# Patient Record
Sex: Female | Born: 1960 | Race: Black or African American | Hispanic: No | Marital: Single | Smoking: Former smoker
Health system: Southern US, Community
[De-identification: ages and names within clinical notes are randomized; demographics above are authoritative.]

## PROBLEM LIST (undated history)

## (undated) DIAGNOSIS — K219 Gastro-esophageal reflux disease without esophagitis: Secondary | ICD-10-CM

## (undated) DIAGNOSIS — M1990 Unspecified osteoarthritis, unspecified site: Secondary | ICD-10-CM

## (undated) DIAGNOSIS — J449 Chronic obstructive pulmonary disease, unspecified: Secondary | ICD-10-CM

## (undated) DIAGNOSIS — E119 Type 2 diabetes mellitus without complications: Secondary | ICD-10-CM

## (undated) DIAGNOSIS — M25471 Effusion, right ankle: Secondary | ICD-10-CM

## (undated) DIAGNOSIS — D649 Anemia, unspecified: Secondary | ICD-10-CM

## (undated) DIAGNOSIS — M25472 Effusion, left ankle: Secondary | ICD-10-CM

## (undated) DIAGNOSIS — IMO0001 Reserved for inherently not codable concepts without codable children: Secondary | ICD-10-CM

## (undated) DIAGNOSIS — K5900 Constipation, unspecified: Secondary | ICD-10-CM

## (undated) HISTORY — PX: HERNIA REPAIR: SHX51

## (undated) HISTORY — PX: KNEE ARTHROSCOPY: SHX127

## (undated) HISTORY — PX: FINGER SURGERY: SHX640

## (undated) HISTORY — PX: CHOLECYSTECTOMY: SHX55

## (undated) HISTORY — PX: TUBAL LIGATION: SHX77

## (undated) HISTORY — PX: COLONOSCOPY: SHX174

## (undated) HISTORY — PX: CARPAL TUNNEL RELEASE: SHX101

---

## 2015-07-28 ENCOUNTER — Other Ambulatory Visit: Payer: Self-pay | Admitting: Neurological Surgery

## 2015-07-28 DIAGNOSIS — M4316 Spondylolisthesis, lumbar region: Secondary | ICD-10-CM

## 2015-07-29 ENCOUNTER — Other Ambulatory Visit: Payer: Self-pay | Admitting: Orthopaedic Surgery

## 2015-08-07 HISTORY — PX: PARATHYROIDECTOMY: SHX19

## 2015-08-12 ENCOUNTER — Ambulatory Visit
Admission: RE | Admit: 2015-08-12 | Discharge: 2015-08-12 | Disposition: A | Discharge status: Home / Self Care | Payer: BLUE CROSS/BLUE SHIELD | Source: Ambulatory Visit | Attending: Neurological Surgery | Admitting: Neurological Surgery

## 2015-08-12 DIAGNOSIS — M4316 Spondylolisthesis, lumbar region: Secondary | ICD-10-CM

## 2015-08-27 ENCOUNTER — Inpatient Hospital Stay (HOSPITAL_COMMUNITY)
Admission: RE | Admit: 2015-08-27 | Discharge: 2015-08-27 | Disposition: A | Discharge status: Home / Self Care | Payer: Self-pay | Source: Ambulatory Visit

## 2015-08-27 NOTE — Pre-Procedure Instructions (Signed)
Murriel HopperSharon Esh  08/27/2015      [id number] - [place], [place] - [address] [place] [id number] Phone: [phone] Fax: [phone]    Your procedure is scheduled on 09/07/15  [facility] at 815 A.M.  Call this number if you have problems the morning of surgery:  [phone]   Remember:  Do not eat food or drink liquids after midnight.  Take these medicines the morning of surgery with A SIP OF WATER prilosec, pain med and advair if needed   STOP all herbel meds, nsaids (aleve,naproxen,advil,ibuprofen) 5 days prior to surgery starting 09/01/15 including aspirin, vit D    How to Manage Your Diabetes Before Surgery   Why is it important to control my blood sugar before and after surgery?   Improving blood sugar levels before and after surgery helps healing and can limit problems.  A way of improving blood sugar control is eating a healthy diet by:  - Eating less sugar and carbohydrates  - Increasing activity/exercise  - Talk with your doctor about reaching your blood sugar goals  High blood sugars (greater than 180 mg/dL) can raise your risk of infections and slow down your recovery so you will need to focus on controlling your diabetes during the weeks before surgery.  Make sure that the doctor who takes care of your diabetes knows about your planned surgery including the date and location.  How do I manage my blood sugars before surgery?   Check your blood sugar at least 4 times a day, 2 days before surgery to make sure that they are not too high or low.   Check your blood sugar the morning of your surgery when you wake up and every 2               hours until you get to the Short-Stay unit.  If your blood sugar is less than 70 mg/dL, you will need to treat for low blood sugar by:  Treat a low blood sugar (less than 70 mg/dL) with 1/2 cup of clear juice (cranberry or apple), 4  glucose tablets, OR glucose gel.  Recheck blood sugar in 15 minutes after treatment (to make sure it is greater than 70 mg/dL).  If blood sugar is not greater than 70 mg/dL on re-check, call [phone] for further instructions.   Report your blood sugar to the Short-Stay nurse when you get to Short-Stay.  References:  [facility], 2007 "How to Manage your Diabetes Before and After Surgery".  What do I do about my diabetes medications?   Do not take oral diabetes medicines (pills) the morning of surgery.(metformin)   Do not wear jewelry, make-up or nail polish.  Do not wear lotions, powders, or perfumes.  You may wear deodorant.  Do not shave 48 hours prior to surgery.  Men may shave face and neck.  Do not bring valuables to the hospital.  [facility] is not responsible for any belongings or valuables.  Contacts, dentures or bridgework may not be worn into surgery.  Leave your suitcase in the car.  After surgery it may be brought to your room.  For patients admitted to the hospital, discharge time will be determined by your treatment team.  Patients discharged the day of surgery will not be allowed to drive home.   Name and phone number of your driver:    Special instructions:  Special Instructions:  - Preparing for Surgery  Before surgery, you can play an important role.  Because skin is not sterile, your skin needs to be as free of germs as possible.  You can reduce the number of germs on you skin by washing with CHG (chlorahexidine gluconate) soap before surgery.  CHG is an antiseptic cleaner which kills germs and bonds with the skin to continue killing germs even after washing.  Please DO NOT use if you have an allergy to CHG or antibacterial soaps.  If your skin becomes reddened/irritated stop using the CHG and inform your nurse when you arrive at Short Stay.  Do not shave (including legs and underarms) for at least 48 hours prior to the  first CHG shower.  You may shave your face.  Please follow these instructions carefully:   1.  Shower with CHG Soap the night before surgery and the morning of Surgery.  2.  If you choose to wash your hair, wash your hair first as usual with your normal shampoo.  3.  After you shampoo, rinse your hair and body thoroughly to remove the Shampoo.  4.  Use CHG as you would any other liquid soap.  You can apply chg directly  to the skin and wash gently with scrungie or a clean washcloth.  5.  Apply the CHG Soap to your body ONLY FROM THE NECK DOWN.  Do not use on open wounds or open sores.  Avoid contact with your eyes ears, mouth and genitals (private parts).  Wash genitals (private parts)       with your normal soap.  6.  Wash thoroughly, paying special attention to the area where your surgery will be performed.  7.  Thoroughly rinse your body with warm water from the neck down.  8.  DO NOT shower/wash with your normal soap after using and rinsing off the CHG Soap.  9.  Pat yourself dry with a clean towel.            10.  Wear clean pajamas.            11.  Place clean sheets on your bed the night of your first shower and do not sleep with pets.  Day of Surgery  Do not apply any lotions/deodorants the morning of surgery.  Please wear clean clothes to the hospital/surgery center.  Please read over the following fact sheets that you were given. Pain Booklet, Coughing and Deep Breathing, Total Joint Packet, MRSA Information and Surgical Site Infection Prevention

## 2015-09-03 ENCOUNTER — Encounter (HOSPITAL_COMMUNITY): Payer: Self-pay

## 2015-09-03 ENCOUNTER — Encounter (HOSPITAL_COMMUNITY)
Admission: RE | Admit: 2015-09-03 | Discharge: 2015-09-03 | Disposition: A | Discharge status: Home / Self Care | Payer: BLUE CROSS/BLUE SHIELD | Source: Ambulatory Visit | Attending: Orthopaedic Surgery | Admitting: Orthopaedic Surgery

## 2015-09-03 ENCOUNTER — Ambulatory Visit (HOSPITAL_COMMUNITY)
Admission: RE | Admit: 2015-09-03 | Discharge: 2015-09-03 | Disposition: A | Discharge status: Home / Self Care | Payer: BLUE CROSS/BLUE SHIELD | Source: Ambulatory Visit | Attending: Orthopaedic Surgery | Admitting: Orthopaedic Surgery

## 2015-09-03 ENCOUNTER — Encounter (INDEPENDENT_AMBULATORY_CARE_PROVIDER_SITE_OTHER): Payer: Self-pay

## 2015-09-03 DIAGNOSIS — R001 Bradycardia, unspecified: Secondary | ICD-10-CM | POA: Diagnosis not present

## 2015-09-03 DIAGNOSIS — Z0183 Encounter for blood typing: Secondary | ICD-10-CM | POA: Diagnosis not present

## 2015-09-03 DIAGNOSIS — Z87891 Personal history of nicotine dependence: Secondary | ICD-10-CM | POA: Diagnosis not present

## 2015-09-03 DIAGNOSIS — M5134 Other intervertebral disc degeneration, thoracic region: Secondary | ICD-10-CM | POA: Diagnosis not present

## 2015-09-03 DIAGNOSIS — M179 Osteoarthritis of knee, unspecified: Secondary | ICD-10-CM | POA: Insufficient documentation

## 2015-09-03 DIAGNOSIS — Z01818 Encounter for other preprocedural examination: Secondary | ICD-10-CM | POA: Diagnosis present

## 2015-09-03 DIAGNOSIS — Z01812 Encounter for preprocedural laboratory examination: Secondary | ICD-10-CM | POA: Diagnosis not present

## 2015-09-03 HISTORY — DX: Gastro-esophageal reflux disease without esophagitis: K21.9

## 2015-09-03 HISTORY — DX: Type 2 diabetes mellitus without complications: E11.9

## 2015-09-03 HISTORY — DX: Effusion, right ankle: M25.471

## 2015-09-03 HISTORY — DX: Effusion, left ankle: M25.472

## 2015-09-03 HISTORY — DX: Chronic obstructive pulmonary disease, unspecified: J44.9

## 2015-09-03 HISTORY — DX: Constipation, unspecified: K59.00

## 2015-09-03 HISTORY — DX: Unspecified osteoarthritis, unspecified site: M19.90

## 2015-09-03 LAB — CBC WITH DIFFERENTIAL/PLATELET
Basophils Absolute: 0 10*3/uL (ref 0.0–0.1)
Basophils Relative: 0 %
EOS ABS: 0.2 10*3/uL (ref 0.0–0.7)
Eosinophils Relative: 2 %
HEMATOCRIT: 36.5 % (ref 36.0–46.0)
HEMOGLOBIN: 11.6 g/dL — AB (ref 12.0–15.0)
LYMPHS ABS: 3.2 10*3/uL (ref 0.7–4.0)
Lymphocytes Relative: 41 %
MCH: 25.7 pg — AB (ref 26.0–34.0)
MCHC: 31.8 g/dL (ref 30.0–36.0)
MCV: 80.8 fL (ref 78.0–100.0)
MONOS PCT: 5 %
Monocytes Absolute: 0.4 10*3/uL (ref 0.1–1.0)
NEUTROS ABS: 4 10*3/uL (ref 1.7–7.7)
NEUTROS PCT: 52 %
Platelets: 288 10*3/uL (ref 150–400)
RBC: 4.52 MIL/uL (ref 3.87–5.11)
RDW: 14.2 % (ref 11.5–15.5)
WBC: 7.6 10*3/uL (ref 4.0–10.5)

## 2015-09-03 LAB — TYPE AND SCREEN
ABO/RH(D): B NEG
Antibody Screen: NEGATIVE

## 2015-09-03 LAB — APTT: aPTT: 32 seconds (ref 24–37)

## 2015-09-03 LAB — URINALYSIS, ROUTINE W REFLEX MICROSCOPIC
Glucose, UA: NEGATIVE mg/dL
Ketones, ur: NEGATIVE mg/dL
NITRITE: NEGATIVE
Protein, ur: 30 mg/dL — AB
Specific Gravity, Urine: 1.024 (ref 1.005–1.030)
UROBILINOGEN UA: 1 mg/dL (ref 0.0–1.0)
pH: 6 (ref 5.0–8.0)

## 2015-09-03 LAB — BASIC METABOLIC PANEL
Anion gap: 9 (ref 5–15)
BUN: 10 mg/dL (ref 6–20)
CHLORIDE: 103 mmol/L (ref 101–111)
CO2: 24 mmol/L (ref 22–32)
Calcium: 8.9 mg/dL (ref 8.9–10.3)
Creatinine, Ser: 0.86 mg/dL (ref 0.44–1.00)
GFR calc non Af Amer: >60 mL/min (ref >60)
Glucose, Bld: 91 mg/dL (ref 65–99)
Potassium: 3.7 mmol/L (ref 3.5–5.1)
Sodium: 136 mmol/L (ref 135–145)

## 2015-09-03 LAB — SURGICAL PCR SCREEN
MRSA, PCR: NEGATIVE
Staphylococcus aureus: NEGATIVE

## 2015-09-03 LAB — URINE MICROSCOPIC-ADD ON

## 2015-09-03 LAB — ABO/RH: ABO/RH(D): B NEG

## 2015-09-03 LAB — PROTIME-INR
INR: 1.07 (ref 0.00–1.49)
Prothrombin Time: 14.1 seconds (ref 11.6–15.2)

## 2015-09-03 LAB — GLUCOSE, CAPILLARY: Glucose-Capillary: 98 mg/dL (ref 65–99)

## 2015-09-03 NOTE — Progress Notes (Signed)
PCP is Warrick ParisianSonia Duggal in [place]  Patient denied having any acute cardiac issues, but did inform Nurse that she has COPD, but denied having any acute shortness of breath.  Nurse inquired about blood glucose and patient stated the highest her blood sugar has been was 149 and the lowest was 89. Patient was unaware of what her last A1C was.

## 2015-09-03 NOTE — Progress Notes (Signed)
Add on slip for urine culture released and sent to main lab.

## 2015-09-03 NOTE — Progress Notes (Signed)
Urine results called in to Agustin CreeKathy Blume at Dr. Nolon Nationsalldorf's office. Kathy instructed Nurse to order a urine culture. Will enter orders.

## 2015-09-03 NOTE — Pre-Procedure Instructions (Signed)
Rhonda HopperSharon Salada  09/03/2015     Your procedure is scheduled on : Tuesday September 14, 2015.  Report [facility] at 11:15 A.M.  Call this number if you have problems the morning of surgery: [phone]    Remember:  Do not eat food or drink liquids after midnight.  Take these medicines the morning of surgery with A SIP OF WATER : Acetaminophen (Tylenol) if needed, Advair inhaler, Omeprazole (Prilosec), Oxycodone (Percocet) if needed   Stop taking any vitamins,herbal medications, Ibuprofen, Advil, Motrin, Aleve, etc on Tuesday November 1st   Do NOT take any diabetic pills the morning of your surgery (NO Metformin/Glucophage)   How to Manage Your Diabetes Before Surgery   Why is it important to control my blood sugar before and after surgery?   Improving blood sugar levels before and after surgery helps healing and can limit problems.  A way of improving blood sugar control is eating a healthy diet by:  - Eating less sugar and carbohydrates  - Increasing activity/exercise  - Talk with your doctor about reaching your blood sugar goals  High blood sugars (greater than 180 mg/dL) can raise your risk of infections and slow down your recovery so you will need to focus on controlling your diabetes during the weeks before surgery.  Make sure that the doctor who takes care of your diabetes knows about your planned surgery including the date and location.  How do I manage my blood sugars before surgery?   Check your blood sugar at least 4 times a day, 2 days before surgery to make sure that they are not too high or low.   Check your blood sugar the morning of your surgery when you wake up and every 2 hours until you get to the Short-Stay unit.  If your blood sugar is less than 70 mg/dL, you will need to treat for low blood sugar by:  Treat a low blood sugar (less than 70 mg/dL) with 1/2 cup of clear juice (cranberry or apple), 4 glucose tablets, OR glucose  gel.  Recheck blood sugar in 15 minutes after treatment (to make sure it is greater than 70 mg/dL).  If blood sugar is not greater than 70 mg/dL on re-check, call [phone] for further instructions.   Report your blood sugar to the Short-Stay nurse when you get to Short-Stay.  References:  [facility], 2007 "How to Manage your Diabetes Before and After Surgery".  What do I do about my diabetes medications?   Do not take oral diabetes medicines (pills) the morning of surgery.   Do not wear jewelry, make-up or nail polish.  Do not wear lotions, powders, or perfumes.   Do not shave 48 hours prior to surgery.    Do not bring valuables to the hospital.  [facility] is not responsible for any belongings or valuables.  Contacts, dentures or bridgework may not be worn into surgery.  Leave your suitcase in the car.  After surgery it may be brought to your room.  For patients admitted to the hospital, discharge time will be determined by your treatment team.  Patients discharged the day of surgery will not be allowed to drive home.   Name and phone number of your driver:    Special instructions:  Shower using CHG soap the night before and the morning of your surgery  Please read over the following fact sheets that you were given. Pain Booklet, Coughing and Deep Breathing, Blood Transfusion  Information, MRSA Information and Surgical Site Infection Prevention

## 2015-09-04 LAB — URINE CULTURE

## 2015-09-04 LAB — HEMOGLOBIN A1C
HEMOGLOBIN A1C: 6.1 % — AB (ref 4.8–5.6)
Mean Plasma Glucose: 128 mg/dL

## 2015-09-13 NOTE — H&P (Signed)
TOTAL KNEE ADMISSION H&P  Patient is being admitted for right total knee arthroplasty.  Subjective:  Chief Complaint:right knee pain.  HPI: Rhonda Ward, 54 y.o. female, has a history of pain and functional disability in the right knee due to arthritis and has failed non-surgical conservative treatments for greater than 12 weeks to includeNSAID's and/or analgesics, corticosteriod injections, viscosupplementation injections, supervised PT with diminished ADL's post treatment, weight reduction as appropriate and activity modification.  Onset of symptoms was gradual, starting 5 years ago with gradually worsening course since that time. The patient noted no past surgery on the right knee(s).  Patient currently rates pain in the right knee(s) at 9 out of 10 with activity. Patient has night pain, worsening of pain with activity and weight bearing, pain that interferes with activities of daily living, pain with passive range of motion, crepitus and joint swelling.  Patient has evidence of subchondral sclerosis, periarticular osteophytes and joint space narrowing by imaging studies. This patient has had no. There is no active infection.  There are no active problems to display for this patient.  Past Medical History  Diagnosis Date  . COPD (chronic obstructive pulmonary disease) (HCC)   . Diabetes mellitus without complication (HCC)     Type 2  . Constipation   . GERD (gastroesophageal reflux disease)   . Arthritis   . Swelling of both ankles     Past Surgical History  Procedure Laterality Date  . Carpal tunnel release Bilateral   . Knee arthroscopy Right   . Cholecystectomy    . Hernia repair      X 3  . Tubal ligation    . Colonoscopy    . Finger surgery Bilateral     Thumbs  . Parathyroidectomy  October 2016    No prescriptions prior to admission   No Known Allergies  Social History  Substance Use Topics  . Smoking status: Former Games developer  . Smokeless tobacco: Not on file  .  Alcohol Use: No    No family history on file.   Review of Systems  All other systems reviewed and are negative.   Objective:  Physical Exam  Constitutional: She is oriented to person, place, and time. She appears well-nourished.  HENT:  Head: Normocephalic.  Eyes: Pupils are equal, round, and reactive to light.  Neck: Neck supple.  Cardiovascular: Normal rate.   Respiratory: Effort normal.  GI: Soft.  Musculoskeletal:  Pain and right knee severe with range of motion.  Tracy effusion crepitation 1+ pain medial joint line walks with antalgic gait  Neurological: She is oriented to person, place, and time.  Skin: Skin is dry.  Psychiatric: She has a normal mood and affect.    Vital signs in last 24 hours:    Labs:   There is no height or weight on file to calculate BMI.   Imaging Review Plain radiographs demonstrate severe degenerative joint disease of the right knee(s). The overall alignment ismild valgus. The bone quality appears to be good for age and reported activity level.  Assessment/Plan:  End stage arthritis, right kneePrimary osteoarthritis right knee   The patient history, physical examination, clinical judgment of the provider and imaging studies are consistent with end stage degenerative joint disease of the right knee(s) and total knee arthroplasty is deemed medically necessary. The treatment options including medical management, injection therapy arthroscopy and arthroplasty were discussed at length. The risks and benefits of total knee arthroplasty were presented and reviewed. The risks due to aseptic loosening,  infection, stiffness, patella tracking problems, thromboembolic complications and other imponderables were discussed. The patient acknowledged the explanation, agreed to proceed with the plan and consent was signed. Patient is being admitted for inpatient treatment for surgery, pain control, PT, OT, prophylactic antibiotics, VTE prophylaxis, progressive  ambulation and ADL's and discharge planning. The patient is planning to be discharged home with home health services

## 2015-09-14 ENCOUNTER — Inpatient Hospital Stay (HOSPITAL_COMMUNITY)
Admission: RE | Admit: 2015-09-14 | Discharge: 2015-09-17 | DRG: 470 | Disposition: A | Discharge status: Home Health | Payer: BLUE CROSS/BLUE SHIELD | Source: Ambulatory Visit | Attending: Orthopaedic Surgery | Admitting: Orthopaedic Surgery

## 2015-09-14 ENCOUNTER — Encounter (HOSPITAL_COMMUNITY): Payer: Self-pay | Admitting: Anesthesiology

## 2015-09-14 ENCOUNTER — Inpatient Hospital Stay (HOSPITAL_COMMUNITY): Payer: BLUE CROSS/BLUE SHIELD | Admitting: Anesthesiology

## 2015-09-14 ENCOUNTER — Encounter (HOSPITAL_COMMUNITY)
Admission: RE | Disposition: A | Discharge status: Home Health | Payer: Self-pay | Source: Ambulatory Visit | Attending: Orthopaedic Surgery

## 2015-09-14 DIAGNOSIS — D649 Anemia, unspecified: Secondary | ICD-10-CM | POA: Diagnosis present

## 2015-09-14 DIAGNOSIS — J449 Chronic obstructive pulmonary disease, unspecified: Secondary | ICD-10-CM | POA: Diagnosis present

## 2015-09-14 DIAGNOSIS — Z87891 Personal history of nicotine dependence: Secondary | ICD-10-CM

## 2015-09-14 DIAGNOSIS — K219 Gastro-esophageal reflux disease without esophagitis: Secondary | ICD-10-CM | POA: Diagnosis present

## 2015-09-14 DIAGNOSIS — E119 Type 2 diabetes mellitus without complications: Secondary | ICD-10-CM | POA: Diagnosis present

## 2015-09-14 DIAGNOSIS — M1711 Unilateral primary osteoarthritis, right knee: Principal | ICD-10-CM | POA: Diagnosis present

## 2015-09-14 DIAGNOSIS — E118 Type 2 diabetes mellitus with unspecified complications: Secondary | ICD-10-CM | POA: Diagnosis present

## 2015-09-14 DIAGNOSIS — N390 Urinary tract infection, site not specified: Secondary | ICD-10-CM | POA: Diagnosis present

## 2015-09-14 HISTORY — DX: Anemia, unspecified: D64.9

## 2015-09-14 HISTORY — DX: Reserved for inherently not codable concepts without codable children: IMO0001

## 2015-09-14 HISTORY — PX: TOTAL KNEE ARTHROPLASTY: SHX125

## 2015-09-14 LAB — GLUCOSE, CAPILLARY
GLUCOSE-CAPILLARY: 114 mg/dL — AB (ref 65–99)
Glucose-Capillary: 118 mg/dL — ABNORMAL HIGH (ref 65–99)
Glucose-Capillary: 96 mg/dL (ref 65–99)
Glucose-Capillary: 87 mg/dL (ref 65–99)

## 2015-09-14 SURGERY — ARTHROPLASTY, KNEE, TOTAL
Anesthesia: Spinal | Site: Knee | Laterality: Right

## 2015-09-14 MED ORDER — METHOCARBAMOL 500 MG PO TABS
500.0000 mg | ORAL_TABLET | Freq: Four times a day (QID) | ORAL | Status: DC | PRN
  Administered 2015-09-14 – 2015-09-17 (×6): 500 mg via ORAL
  Filled 2015-09-14 (×5): qty 1

## 2015-09-14 MED ORDER — CEFAZOLIN SODIUM-DEXTROSE 2-3 GM-% IV SOLR
2.0000 g | INTRAVENOUS | Status: AC
  Administered 2015-09-14: 2 g via INTRAVENOUS

## 2015-09-14 MED ORDER — LACTATED RINGERS IV SOLN
INTRAVENOUS | Status: DC
  Administered 2015-09-14: 10:00:00 via INTRAVENOUS
  Administered 2015-09-14: 50 mL/h via INTRAVENOUS

## 2015-09-14 MED ORDER — CHLORHEXIDINE GLUCONATE 4 % EX LIQD: 60.0000 mL | Freq: Once | CUTANEOUS | Status: DC

## 2015-09-14 MED ORDER — PROMETHAZINE HCL 25 MG/ML IJ SOLN: 6.2500 mg | INTRAMUSCULAR | Status: DC | PRN

## 2015-09-14 MED ORDER — BUPIVACAINE LIPOSOME 1.3 % IJ SUSP
INTRAMUSCULAR | Status: DC | PRN
  Administered 2015-09-14: 20 mL

## 2015-09-14 MED ORDER — LACTATED RINGERS IV SOLN
INTRAVENOUS | Status: DC
  Administered 2015-09-14 (×2): via INTRAVENOUS

## 2015-09-14 MED ORDER — METHOCARBAMOL 500 MG PO TABS
ORAL_TABLET | ORAL | Status: AC
  Administered 2015-09-14: 500 mg via ORAL
  Filled 2015-09-14: qty 1

## 2015-09-14 MED ORDER — METOCLOPRAMIDE HCL 5 MG PO TABS: 5.0000 mg | ORAL_TABLET | Freq: Three times a day (TID) | ORAL | Status: DC | PRN

## 2015-09-14 MED ORDER — PHENOL 1.4 % MT LIQD
1.0000 | OROMUCOSAL | Status: DC | PRN
Start: 2015-09-14 — End: 2015-09-17

## 2015-09-14 MED ORDER — PANTOPRAZOLE SODIUM 40 MG PO TBEC
40.0000 mg | DELAYED_RELEASE_TABLET | Freq: Every day | ORAL | Status: DC
  Administered 2015-09-15 – 2015-09-17 (×3): 40 mg via ORAL
  Filled 2015-09-14 (×3): qty 1

## 2015-09-14 MED ORDER — BUPIVACAINE IN DEXTROSE 0.75-8.25 % IT SOLN
INTRATHECAL | Status: DC | PRN
  Administered 2015-09-14: 2 mL via INTRATHECAL

## 2015-09-14 MED ORDER — FENTANYL CITRATE (PF) 250 MCG/5ML IJ SOLN
INTRAMUSCULAR | Status: AC
  Filled 2015-09-14: qty 5

## 2015-09-14 MED ORDER — FUROSEMIDE 40 MG PO TABS
40.0000 mg | ORAL_TABLET | Freq: Every day | ORAL | Status: DC
  Administered 2015-09-15 – 2015-09-16 (×2): 40 mg via ORAL
  Filled 2015-09-14 (×2): qty 1

## 2015-09-14 MED ORDER — INFLUENZA VAC SPLIT QUAD 0.5 ML IM SUSY
0.5000 mL | PREFILLED_SYRINGE | INTRAMUSCULAR | Status: DC
Start: 2015-09-15 — End: 2015-09-17

## 2015-09-14 MED ORDER — ACETAMINOPHEN 650 MG RE SUPP: 650.0000 mg | Freq: Four times a day (QID) | RECTAL | Status: DC | PRN

## 2015-09-14 MED ORDER — ASPIRIN EC 325 MG PO TBEC
325.0000 mg | DELAYED_RELEASE_TABLET | Freq: Two times a day (BID) | ORAL | Status: DC
  Administered 2015-09-15 – 2015-09-17 (×5): 325 mg via ORAL
  Filled 2015-09-14 (×6): qty 1

## 2015-09-14 MED ORDER — METOCLOPRAMIDE HCL 5 MG/ML IJ SOLN
5.0000 mg | Freq: Three times a day (TID) | INTRAMUSCULAR | Status: DC | PRN
  Administered 2015-09-14: 10 mg via INTRAVENOUS
  Filled 2015-09-14: qty 2

## 2015-09-14 MED ORDER — MOMETASONE FURO-FORMOTEROL FUM 100-5 MCG/ACT IN AERO
2.0000 | INHALATION_SPRAY | Freq: Two times a day (BID) | RESPIRATORY_TRACT | Status: DC
  Administered 2015-09-14 – 2015-09-17 (×6): 2 via RESPIRATORY_TRACT
  Filled 2015-09-14: qty 8.8

## 2015-09-14 MED ORDER — BISACODYL 5 MG PO TBEC: 5.0000 mg | DELAYED_RELEASE_TABLET | Freq: Every day | ORAL | Status: DC | PRN

## 2015-09-14 MED ORDER — HYDROMORPHONE HCL 1 MG/ML IJ SOLN
INTRAMUSCULAR | Status: AC
  Administered 2015-09-14: 0.5 mg via INTRAVENOUS
  Filled 2015-09-14: qty 1

## 2015-09-14 MED ORDER — FENTANYL CITRATE (PF) 100 MCG/2ML IJ SOLN
INTRAMUSCULAR | Status: AC
  Filled 2015-09-14: qty 2

## 2015-09-14 MED ORDER — ONDANSETRON HCL 4 MG/2ML IJ SOLN
INTRAMUSCULAR | Status: AC
  Filled 2015-09-14: qty 2

## 2015-09-14 MED ORDER — DOCUSATE SODIUM 100 MG PO CAPS
100.0000 mg | ORAL_CAPSULE | Freq: Two times a day (BID) | ORAL | Status: DC
  Administered 2015-09-14 – 2015-09-17 (×7): 100 mg via ORAL
  Filled 2015-09-14 (×7): qty 1

## 2015-09-14 MED ORDER — BUPIVACAINE-EPINEPHRINE 0.5% -1:200000 IJ SOLN
INTRAMUSCULAR | Status: DC | PRN
  Administered 2015-09-14: 20 mL

## 2015-09-14 MED ORDER — METFORMIN HCL 500 MG PO TABS
500.0000 mg | ORAL_TABLET | Freq: Two times a day (BID) | ORAL | Status: DC
  Administered 2015-09-14 – 2015-09-17 (×6): 500 mg via ORAL
  Filled 2015-09-14 (×6): qty 1

## 2015-09-14 MED ORDER — ONDANSETRON HCL 4 MG/2ML IJ SOLN
4.0000 mg | Freq: Four times a day (QID) | INTRAMUSCULAR | Status: DC | PRN
  Administered 2015-09-14: 4 mg via INTRAVENOUS

## 2015-09-14 MED ORDER — CEFAZOLIN SODIUM-DEXTROSE 2-3 GM-% IV SOLR
INTRAVENOUS | Status: AC
  Filled 2015-09-14: qty 50

## 2015-09-14 MED ORDER — MIDAZOLAM HCL 2 MG/2ML IJ SOLN
INTRAMUSCULAR | Status: AC
  Filled 2015-09-14: qty 4

## 2015-09-14 MED ORDER — PROPOFOL 500 MG/50ML IV EMUL
INTRAVENOUS | Status: DC | PRN
  Administered 2015-09-14: 75 ug/kg/min via INTRAVENOUS

## 2015-09-14 MED ORDER — ALUM & MAG HYDROXIDE-SIMETH 200-200-20 MG/5ML PO SUSP: 30.0000 mL | ORAL | Status: DC | PRN

## 2015-09-14 MED ORDER — CEFAZOLIN SODIUM-DEXTROSE 2-3 GM-% IV SOLR
2.0000 g | Freq: Four times a day (QID) | INTRAVENOUS | Status: AC
  Administered 2015-09-14 – 2015-09-15 (×2): 2 g via INTRAVENOUS
  Filled 2015-09-14 (×2): qty 50

## 2015-09-14 MED ORDER — MENTHOL 3 MG MT LOZG: 1.0000 | LOZENGE | OROMUCOSAL | Status: DC | PRN

## 2015-09-14 MED ORDER — HYDROMORPHONE HCL 1 MG/ML IJ SOLN
0.2500 mg | INTRAMUSCULAR | Status: DC | PRN
  Administered 2015-09-14 (×4): 0.5 mg via INTRAVENOUS

## 2015-09-14 MED ORDER — SODIUM CHLORIDE 0.9 % IR SOLN
Status: DC | PRN
  Administered 2015-09-14: 3000 mL

## 2015-09-14 MED ORDER — LIDOCAINE HCL (CARDIAC) 20 MG/ML IV SOLN
INTRAVENOUS | Status: DC | PRN
  Administered 2015-09-14: 60 mg via INTRAVENOUS

## 2015-09-14 MED ORDER — MIDAZOLAM HCL 5 MG/5ML IJ SOLN
INTRAMUSCULAR | Status: DC | PRN
  Administered 2015-09-14: 2 mg via INTRAVENOUS

## 2015-09-14 MED ORDER — FENTANYL CITRATE (PF) 100 MCG/2ML IJ SOLN
INTRAMUSCULAR | Status: DC | PRN
  Administered 2015-09-14 (×2): 25 ug via INTRAVENOUS
  Administered 2015-09-14: 50 ug via INTRAVENOUS

## 2015-09-14 MED ORDER — TRANEXAMIC ACID 1000 MG/10ML IV SOLN
1000.0000 mg | INTRAVENOUS | Status: AC
  Administered 2015-09-14: 1000 mg via INTRAVENOUS
  Filled 2015-09-14: qty 10

## 2015-09-14 MED ORDER — ONDANSETRON HCL 4 MG/2ML IJ SOLN
INTRAMUSCULAR | Status: DC | PRN
  Administered 2015-09-14: 4 mg via INTRAVENOUS

## 2015-09-14 MED ORDER — ONDANSETRON HCL 4 MG PO TABS: 4.0000 mg | ORAL_TABLET | Freq: Four times a day (QID) | ORAL | Status: DC | PRN

## 2015-09-14 MED ORDER — MIDAZOLAM HCL 2 MG/2ML IJ SOLN
INTRAMUSCULAR | Status: AC
  Filled 2015-09-14: qty 2

## 2015-09-14 MED ORDER — METHOCARBAMOL 1000 MG/10ML IJ SOLN
500.0000 mg | Freq: Four times a day (QID) | INTRAVENOUS | Status: DC | PRN
  Filled 2015-09-14: qty 5

## 2015-09-14 MED ORDER — PROPOFOL 10 MG/ML IV BOLUS
INTRAVENOUS | Status: AC
  Filled 2015-09-14: qty 20

## 2015-09-14 MED ORDER — 0.9 % SODIUM CHLORIDE (POUR BTL) OPTIME
TOPICAL | Status: DC | PRN
  Administered 2015-09-14: 1000 mL

## 2015-09-14 MED ORDER — LACTATED RINGERS IV SOLN
INTRAVENOUS | Status: DC | PRN
  Administered 2015-09-14 (×2): via INTRAVENOUS

## 2015-09-14 MED ORDER — DIPHENHYDRAMINE HCL 12.5 MG/5ML PO ELIX: 12.5000 mg | ORAL_SOLUTION | ORAL | Status: DC | PRN

## 2015-09-14 MED ORDER — BUPIVACAINE LIPOSOME 1.3 % IJ SUSP
20.0000 mL | INTRAMUSCULAR | Status: DC
  Filled 2015-09-14: qty 20

## 2015-09-14 MED ORDER — NYSTATIN 100000 UNIT/GM EX CREA
1.0000 "application " | TOPICAL_CREAM | Freq: Three times a day (TID) | CUTANEOUS | Status: DC | PRN
  Filled 2015-09-14: qty 15

## 2015-09-14 MED ORDER — OXYCODONE HCL 5 MG PO TABS
ORAL_TABLET | ORAL | Status: AC
  Filled 2015-09-14: qty 2

## 2015-09-14 MED ORDER — HYDROMORPHONE HCL 1 MG/ML IJ SOLN
0.5000 mg | INTRAMUSCULAR | Status: DC | PRN
  Administered 2015-09-14 – 2015-09-15 (×4): 1 mg via INTRAVENOUS
  Filled 2015-09-14 (×4): qty 1

## 2015-09-14 MED ORDER — ACETAMINOPHEN 325 MG PO TABS
650.0000 mg | ORAL_TABLET | Freq: Four times a day (QID) | ORAL | Status: DC | PRN
  Administered 2015-09-15 – 2015-09-17 (×4): 650 mg via ORAL
  Filled 2015-09-14 (×5): qty 2

## 2015-09-14 MED ORDER — PROPOFOL 10 MG/ML IV BOLUS
INTRAVENOUS | Status: DC | PRN
  Administered 2015-09-14: 20 mg via INTRAVENOUS
  Administered 2015-09-14 (×2): 10 mg via INTRAVENOUS

## 2015-09-14 MED ORDER — OXYCODONE HCL 5 MG PO TABS
5.0000 mg | ORAL_TABLET | ORAL | Status: DC | PRN
  Administered 2015-09-14 – 2015-09-17 (×13): 10 mg via ORAL
  Filled 2015-09-14 (×12): qty 2

## 2015-09-14 SURGICAL SUPPLY — 67 items
BAG DECANTER FOR FLEXI CONT (MISCELLANEOUS) IMPLANT
BANDAGE ELASTIC 4 VELCRO ST LF (GAUZE/BANDAGES/DRESSINGS) ×3 IMPLANT
BANDAGE ELASTIC 6 VELCRO ST LF (GAUZE/BANDAGES/DRESSINGS) ×3 IMPLANT
BANDAGE ESMARK 6X9 LF (GAUZE/BANDAGES/DRESSINGS) ×1 IMPLANT
BENZOIN TINCTURE PRP APPL 2/3 (GAUZE/BANDAGES/DRESSINGS) ×3 IMPLANT
BLADE SAGITTAL 25.0X1.19X90 (BLADE) IMPLANT
BLADE SAGITTAL 25.0X1.19X90MM (BLADE)
BLADE SAW SGTL 13.0X1.19X90.0M (BLADE) IMPLANT
BLADE SURG ROTATE 9660 (MISCELLANEOUS) IMPLANT
BNDG ELASTIC 6X10 VLCR STRL LF (GAUZE/BANDAGES/DRESSINGS) ×3 IMPLANT
BNDG ESMARK 6X9 LF (GAUZE/BANDAGES/DRESSINGS) ×3
BNDG GAUZE ELAST 4 BULKY (GAUZE/BANDAGES/DRESSINGS) ×6 IMPLANT
BOWL SMART MIX CTS (DISPOSABLE) ×3 IMPLANT
CAP KNEE TOTAL 3 SIGMA ×3 IMPLANT
CEMENT HV SMART SET (Cement) ×6 IMPLANT
CLOSURE STERI-STRIP 1/2X4 (GAUZE/BANDAGES/DRESSINGS) ×1
CLOSURE WOUND 1/2 X4 (GAUZE/BANDAGES/DRESSINGS) ×1
CLSR STERI-STRIP ANTIMIC 1/2X4 (GAUZE/BANDAGES/DRESSINGS) ×2 IMPLANT
COVER SURGICAL LIGHT HANDLE (MISCELLANEOUS) ×3 IMPLANT
CUFF TOURNIQUET SINGLE 34IN LL (TOURNIQUET CUFF) ×3 IMPLANT
CUFF TOURNIQUET SINGLE 44IN (TOURNIQUET CUFF) IMPLANT
DECANTER SPIKE VIAL GLASS SM (MISCELLANEOUS) ×3 IMPLANT
DRAPE EXTREMITY T 121X128X90 (DRAPE) ×3 IMPLANT
DRAPE PROXIMA HALF (DRAPES) ×3 IMPLANT
DRAPE U-SHAPE 47X51 STRL (DRAPES) ×3 IMPLANT
DRSG ADAPTIC 3X8 NADH LF (GAUZE/BANDAGES/DRESSINGS) ×3 IMPLANT
DRSG PAD ABDOMINAL 8X10 ST (GAUZE/BANDAGES/DRESSINGS) ×6 IMPLANT
DURAPREP 26ML APPLICATOR (WOUND CARE) ×3 IMPLANT
ELECT REM PT RETURN 9FT ADLT (ELECTROSURGICAL) ×3
ELECTRODE REM PT RTRN 9FT ADLT (ELECTROSURGICAL) ×1 IMPLANT
GAUZE SPONGE 4X4 12PLY STRL (GAUZE/BANDAGES/DRESSINGS) ×3 IMPLANT
GLOVE BIO SURGEON STRL SZ8 (GLOVE) ×6 IMPLANT
GLOVE BIOGEL PI IND STRL 8 (GLOVE) ×2 IMPLANT
GLOVE BIOGEL PI INDICATOR 8 (GLOVE) ×4
GOWN STRL REUS W/ TWL LRG LVL3 (GOWN DISPOSABLE) ×1 IMPLANT
GOWN STRL REUS W/ TWL XL LVL3 (GOWN DISPOSABLE) ×2 IMPLANT
GOWN STRL REUS W/TWL LRG LVL3 (GOWN DISPOSABLE) ×2
GOWN STRL REUS W/TWL XL LVL3 (GOWN DISPOSABLE) ×4
HANDPIECE INTERPULSE COAX TIP (DISPOSABLE) ×2
HOOD PEEL AWAY FACE SHEILD DIS (HOOD) ×6 IMPLANT
HOOD W/PEELAWAY (MISCELLANEOUS) ×3 IMPLANT
IMMOBILIZER KNEE 22 UNIV (SOFTGOODS) ×3 IMPLANT
KIT BASIN OR (CUSTOM PROCEDURE TRAY) ×3 IMPLANT
KIT ROOM TURNOVER OR (KITS) ×3 IMPLANT
MANIFOLD NEPTUNE II (INSTRUMENTS) ×3 IMPLANT
NEEDLE HYPO 21X1 ECLIPSE (NEEDLE) ×3 IMPLANT
NS IRRIG 1000ML POUR BTL (IV SOLUTION) ×3 IMPLANT
PACK TOTAL JOINT (CUSTOM PROCEDURE TRAY) ×3 IMPLANT
PACK UNIVERSAL I (CUSTOM PROCEDURE TRAY) ×3 IMPLANT
PAD ARMBOARD 7.5X6 YLW CONV (MISCELLANEOUS) ×6 IMPLANT
SET HNDPC FAN SPRY TIP SCT (DISPOSABLE) ×1 IMPLANT
SPONGE GAUZE 4X4 12PLY STER LF (GAUZE/BANDAGES/DRESSINGS) ×3 IMPLANT
STAPLER VISISTAT 35W (STAPLE) IMPLANT
STRIP CLOSURE SKIN 1/2X4 (GAUZE/BANDAGES/DRESSINGS) ×2 IMPLANT
SUCTION FRAZIER TIP 10 FR DISP (SUCTIONS) IMPLANT
SUT MNCRL AB 3-0 PS2 18 (SUTURE) IMPLANT
SUT VIC AB 0 CT1 27 (SUTURE) ×4
SUT VIC AB 0 CT1 27XBRD ANBCTR (SUTURE) ×2 IMPLANT
SUT VIC AB 2-0 CT1 27 (SUTURE) ×6
SUT VIC AB 2-0 CT1 TAPERPNT 27 (SUTURE) ×3 IMPLANT
SUT VLOC 180 0 24IN GS25 (SUTURE) ×3 IMPLANT
SYR 50ML LL SCALE MARK (SYRINGE) ×3 IMPLANT
TOWEL OR 17X24 6PK STRL BLUE (TOWEL DISPOSABLE) ×3 IMPLANT
TOWEL OR 17X26 10 PK STRL BLUE (TOWEL DISPOSABLE) ×3 IMPLANT
TRAY FOLEY CATH 14FR (SET/KITS/TRAYS/PACK) IMPLANT
UPCHARGE REV TRAY MBT KNEE ×3 IMPLANT
WATER STERILE IRR 1000ML POUR (IV SOLUTION) ×6 IMPLANT

## 2015-09-14 NOTE — Clinical Social Work Note (Signed)
CSW received referral for SNF.  Case discussed with case manager and plan is to discharge home.  CSW to sign off please re-consult if social work needs arise.  Adin Lariccia R. Kanye Depree, MSW, LCSWA [phone]  

## 2015-09-14 NOTE — Anesthesia Preprocedure Evaluation (Signed)
Anesthesia Evaluation  Patient identified by MRN, date of birth, ID band Patient awake    Reviewed: Allergy & Precautions, NPO status , Patient's Chart, lab work & pertinent test results  Airway Mallampati: II  TM Distance: >3 FB Neck ROM: Full    Dental no notable dental hx.    Pulmonary COPD, former smoker,    Pulmonary exam normal breath sounds clear to auscultation       Cardiovascular negative cardio ROS Normal cardiovascular exam Rhythm:Regular Rate:Normal     Neuro/Psych negative neurological ROS  negative psych ROS   GI/Hepatic Neg liver ROS, GERD  Medicated,  Endo/Other  diabetes  Renal/GU negative Renal ROS  negative genitourinary   Musculoskeletal negative musculoskeletal ROS (+)   Abdominal   Peds negative pediatric ROS (+)  Hematology negative hematology ROS (+)   Anesthesia Other Findings   Reproductive/Obstetrics negative OB ROS                             Anesthesia Physical Anesthesia Plan  ASA: III  Anesthesia Plan: Spinal   Post-op Pain Management:    Induction: Intravenous  Airway Management Planned: Simple Face Mask  Additional Equipment:   Intra-op Plan:   Post-operative Plan:   Informed Consent: I have reviewed the patients History and Physical, chart, labs and discussed the procedure including the risks, benefits and alternatives for the proposed anesthesia with the patient or authorized representative who has indicated his/her understanding and acceptance.   Dental advisory given  Plan Discussed with: CRNA and Surgeon  Anesthesia Plan Comments:         Anesthesia Quick Evaluation

## 2015-09-14 NOTE — Interval H&P Note (Signed)
OK for surgery PD 

## 2015-09-14 NOTE — Transfer of Care (Signed)
Immediate Anesthesia Transfer of Care Note  Patient: Rhonda HopperSharon Babe  Procedure(s) Performed: Procedure(s): TOTAL KNEE ARTHROPLASTY (Right)  Patient Location: PACU  Anesthesia Type:MAC and Spinal  Level of Consciousness: awake, alert  and oriented  Airway & Oxygen Therapy: Patient Spontanous Breathing  Post-op Assessment: Report given to RN and Post -op Vital signs reviewed and stable  Post vital signs: Reviewed and stable  Last Vitals:  Filed Vitals:   09/14/15 1245  BP:   Pulse:   Temp: 36.8 C  Resp:     Complications: No apparent anesthesia complications

## 2015-09-14 NOTE — Anesthesia Procedure Notes (Addendum)
Spinal Patient location during procedure: OR Staffing Performed by: anesthesiologist  Preanesthetic Checklist Completed: patient identified, site marked, surgical consent, pre-op evaluation, timeout performed, IV checked, risks and benefits discussed and monitors and equipment checked Spinal Block Patient position: sitting Prep: Betadine Patient monitoring: heart rate, continuous pulse ox and blood pressure Location: L3-4 Injection technique: single-shot Needle Needle type: Spinocan  Needle gauge: 22 G Needle length: 9 cm Additional Notes Expiration date of kit checked and confirmed. Patient tolerated procedure well, without complications.    Procedure Name: MAC Date/Time: 09/14/2015 10:55 AM Performed by: Susa Loffler Pre-anesthesia Checklist: Patient identified, Emergency Drugs available, Suction available, Patient being monitored and Timeout performed Patient Re-evaluated:Patient Re-evaluated prior to inductionOxygen Delivery Method: Simple face mask Preoxygenation: Pre-oxygenation with 100% oxygen Dental Injury: Teeth and Oropharynx as per pre-operative assessment

## 2015-09-14 NOTE — Progress Notes (Signed)
Utilization review completed.  

## 2015-09-14 NOTE — Progress Notes (Signed)
Orthopedic Tech Progress Note Patient Details:  Rhonda HopperSharon Ward April 29, 1961 [id number]  [name] [name]: On Right Knee Flexion (Degrees): 90 Right Knee Extension (Degrees): 0 Additional Comments: trapeze bar patient helper Viewed order from doctor's order list  Nikki DomCrawford, Nyko Gell 09/14/2015, 1:16 PM

## 2015-09-14 NOTE — Anesthesia Postprocedure Evaluation (Signed)
  Anesthesia Post-op Note  Patient: Rhonda HopperSharon Ward  Procedure(s) Performed: Procedure(s) (LRB): TOTAL KNEE ARTHROPLASTY (Right)  Patient Location: PACU  Anesthesia Type: Spinal  Level of Consciousness: awake and alert   Airway and Oxygen Therapy: Patient Spontanous Breathing  Post-op Pain: mild  Post-op Assessment: Post-op Vital signs reviewed, Patient's Cardiovascular Status Stable, Respiratory Function Stable, Patent Airway and No signs of Nausea or vomiting  Last Vitals:  Filed Vitals:   09/14/15 1245  BP:   Pulse:   Temp: 36.8 C  Resp:     Post-op Vital Signs: stable   Complications: No apparent anesthesia complications

## 2015-09-14 NOTE — Evaluation (Signed)
Physical Therapy Evaluation Patient Details Name: Rhonda Ward MRN: [id number] DOB: 06-02-1961 Today's Date: 09/14/2015   History of Present Illness  Pt is a 54 y/o F s/p Rt TKA.  Pt's PMH includes COPD, DMII, swelling of Bil ankles, Bil thumb surgery, Bil carpal tunnel release.  Clinical Impression  Pt is s/p Rt TKA resulting in the deficits listed below (see PT Problem List). Ms. Rhonda Ward ambulated 200 ft w/ RW and supervision assist this session.  She will have 24/7 assist available from her daughter at d/c. Pt will benefit from skilled PT to increase their independence and safety with mobility to allow discharge to the venue listed below.     Follow Up Recommendations Home health PT;Supervision for mobility/OOB    Equipment Recommendations  None recommended by PT    Recommendations for Other Services OT consult     Precautions / Restrictions Precautions Precautions: Fall;Knee Precaution Booklet Issued: Yes (comment) Precaution Comments: Reviewed knee precautions Required Braces or Orthoses: Knee Immobilizer - Right Knee Immobilizer - Right: Other (comment) (not specified in order) Restrictions Weight Bearing Restrictions: Yes RLE Weight Bearing: Weight bearing as tolerated      Mobility  Bed Mobility Overal bed mobility: Modified Independent             General bed mobility comments: HOB elevated and use of bed rail. Cues for technique and increased time.  Transfers Overall transfer level: Needs assistance Equipment used: Rolling walker (2 wheeled) Transfers: Sit to/from Stand Sit to Stand: Min guard;From elevated surface         General transfer comment: Cues for proper hand placement and technique using RW.    Ambulation/Gait Ambulation/Gait assistance: Supervision Ambulation Distance (Feet): 200 Feet Assistive device: Rolling walker (2 wheeled) Gait Pattern/deviations: Step-to pattern;Antalgic;Decreased weight shift to right;Decreased stride  length;Decreased stance time - right   Gait velocity interpretation: Below normal speed for age/gender General Gait Details: Cues for upright posture.  Supervision for pt's safety.    Stairs            Wheelchair Mobility    Modified Rankin (Stroke Patients Only)       Balance Overall balance assessment: Needs assistance Sitting-balance support: Bilateral upper extremity supported;Feet supported Sitting balance-Leahy Scale: Good     Standing balance support: During functional activity Standing balance-Leahy Scale: Fair Standing balance comment: Pt able to stand w/ neither UE supported to adjust gown in standing                             Pertinent Vitals/Pain Pain Assessment: 0-10 Pain Score: 7  Pain Location: Rt knee Pain Descriptors / Indicators: Aching;Sore Pain Intervention(s): Limited activity within patient's tolerance;Monitored during session;Repositioned;Premedicated before session    Home Living Family/patient expects to be discharged to:: Private residence Living Arrangements: Children Available Help at Discharge: Family;Available 24 hours/day (teenaged daughter ) Type of Home: Apartment Home Access: Stairs to enter Entrance Stairs-Rails: None;Left;Right (see additional comments) Entrance Stairs-Number of Steps: 6 (+1) Home Layout: One level Home Equipment: Walker - 2 wheels;Bedside commode Additional Comments: Pt has 6 steps w/ Bil rails (unable to reach both at the same time) and then a separate step up into apartment w/o rails    Prior Function Level of Independence: Independent               Hand Dominance        Extremity/Trunk Assessment   Upper Extremity Assessment: Overall WFL for tasks assessed;Defer  to OT evaluation           Lower Extremity Assessment: RLE deficits/detail RLE Deficits / Details: weakness and limited ROM as expected s/p Rt TKA       Communication   Communication: No difficulties  Cognition  Arousal/Alertness: Awake/alert Behavior During Therapy: WFL for tasks assessed/performed Overall Cognitive Status: Within Functional Limits for tasks assessed                      General Comments General comments (skin integrity, edema, etc.): Pt reports nausea at end of session sitting in recliner which dissipates over the next few minutes    Exercises Total Joint Exercises Ankle Circles/Pumps: AROM;Both;10 reps;Supine Quad Sets: Strengthening;Both;10 reps;Supine Straight Leg Raises: AROM;Right;10 reps;Supine Goniometric ROM:  (unable to get ROM knee flexion 2/2 nausea at end of session)      Assessment/Plan    PT Assessment Patient needs continued PT services  PT Diagnosis Abnormality of gait;Acute pain   PT Problem List Decreased strength;Decreased range of motion;Decreased activity tolerance;Decreased balance;Decreased mobility;Decreased knowledge of use of DME;Decreased safety awareness;Decreased knowledge of precautions;Pain  PT Treatment Interventions DME instruction;Gait training;Stair training;Functional mobility training;Therapeutic activities;Therapeutic exercise;Balance training;Neuromuscular re-education;Patient/family education;Modalities   PT Goals (Current goals can be found in the Care Plan section) Acute Rehab PT Goals Patient Stated Goal: to go home when ready PT Goal Formulation: With patient/family Time For Goal Achievement: 09/21/15 Potential to Achieve Goals: Good    Frequency 7X/week   Barriers to discharge Inaccessible home environment steps to enter home    Co-evaluation               End of Session Equipment Utilized During Treatment: Gait belt;Right knee immobilizer Activity Tolerance: Patient tolerated treatment well;Patient limited by pain Patient left: in chair;with call bell/phone within reach;with family/visitor present Nurse Communication: Mobility status;Precautions;Weight bearing status         Time: 1610-9604 PT Time  Calculation (min) (ACUTE ONLY): 30 min   Charges:   PT Evaluation $Initial PT Evaluation Tier I: 1 Procedure PT Treatments $Gait Training: 8-22 mins   PT G CodesMichail Jewels PT, DPT [phone] Pager: [phone] 09/14/2015, 5:05 PM

## 2015-09-14 NOTE — Op Note (Addendum)
PREOP DIAGNOSIS: DJD RIGHT KNEE POSTOP DIAGNOSIS: same PROCEDURE: RIGHT TKR ANESTHESIA: Spinal and MAC ATTENDING SURGEON: Amarea Macdowell G ASSISTANT: Elodia FlorenceAndrew Nida PA  INDICATIONS FOR PROCEDURE: Rhonda SkeansSharon Ward is a 54 y.o. female who has struggled for a long time with pain due to degenerative arthritis of the right knee.  The patient has failed many conservative non-operative measures and at this point has pain which limits the ability to sleep and walk.  The patient is offered total knee replacement.  Informed operative consent was obtained after discussion of possible risks of anesthesia, infection, neurovascular injury, DVT, and death.  The importance of the post-operative rehabilitation protocol to optimize result was stressed extensively with the patient.  SUMMARY OF FINDINGS AND PROCEDURE:  Rhonda HopperSharon Ward was taken to the operative suite where under the above anesthesia a right knee replacement was performed.  There were advanced degenerative changes and the bone quality was excellent.  We used the DePuy LCS system and placed size standard plus femur, 3 MBT revision tibia, 35 mm all polyethylene patella, and a size 10 mm spacer.  Elodia FlorenceAndrew Nida PA-C assisted throughout and was invaluable to the completion of the case in that he helped retract and maintain exposure while I placed components.  He also helped close thereby minimizing OR time.  The patient was admitted for appropriate post-op care to include perioperative antibiotics and mechanical and pharmacologic measures for DVT prophylaxis.  DESCRIPTION OF PROCEDURE:  Rhonda HopperSharon Ward was taken to the operative suite where the above anesthesia was applied.  The patient was positioned supine and prepped and draped in normal sterile fashion.  An appropriate time out was performed.  After the administration of kefzol pre-op antibiotic the leg was elevated and exsanguinated and a tourniquet inflated. A standard longitudinal incision was made on the anterior  knee.  Dissection was carried down to the extensor mechanism.  All appropriate anti-infective measures were used including the pre-operative antibiotic, betadine impregnated drape, and closed hooded exhaust systems for each member of the surgical team.  A medial parapatellar incision was made in the extensor mechanism and the knee cap flipped and the knee flexed.  Some residual meniscal tissues were removed along with any remaining ACL/PCL tissue.  A guide was placed on the tibia and a flat cut was made on it's superior surface.  An intramedullary guide was placed in the femur and was utilized to make anterior and posterior cuts creating an appropriate flexion gap.  A second intramedullary guide was placed in the femur to make a distal cut properly balancing the knee with an extension gap equal to the flexion gap.  The three bones sized to the above mentioned sizes and the appropriate guides were placed and utilized.  A trial reduction was done and the knee easily came to full extension and the patella tracked well on flexion.  The trial components were removed and all bones were cleaned with pulsatile lavage and then dried thoroughly.  Cement was mixed and was pressurized onto the bones followed by placement of the aforementioned components.  Excess cement was trimmed and pressure was held on the components until the cement had hardened.  The tourniquet was deflated and a small amount of bleeding was controlled with cautery and pressure.  The knee was irrigated thoroughly.  The extensor mechanism was re-approximated with V-loc suture in running fashion.  The knee was flexed and the repair was solid.  The subcutaneous tissues were re-approximated with #0 and #2-0 vicryl and the skin closed with a  subcuticular stitch and steristrips.  A sterile dressing was applied.  Intraoperative fluids, EBL, and tourniquet time can be obtained from anesthesia records.  DISPOSITION:  The patient was taken to recovery room in  stable condition and admitted for appropriate post-op care to include peri-operative antibiotic and DVT prophylaxis with mechanical and pharmacologic measures.  Tandre Conly G 09/14/2015, 12:15 PM

## 2015-09-15 ENCOUNTER — Encounter (HOSPITAL_COMMUNITY): Payer: Self-pay | Admitting: Orthopaedic Surgery

## 2015-09-15 LAB — GLUCOSE, CAPILLARY
GLUCOSE-CAPILLARY: 123 mg/dL — AB (ref 65–99)
GLUCOSE-CAPILLARY: 115 mg/dL — AB (ref 65–99)
Glucose-Capillary: 100 mg/dL — ABNORMAL HIGH (ref 65–99)
Glucose-Capillary: 94 mg/dL (ref 65–99)

## 2015-09-15 NOTE — Progress Notes (Signed)
Physical Therapy Treatment Patient Details Name: Rhonda HopperSharon Frentz MRN: [id number] DOB: 07-28-1961 Today's Date: 09/15/2015    History of Present Illness Pt is a 54 y/o F s/p Rt TKA.  Pt's PMH includes COPD, DMII, swelling of Bil ankles, Bil thumb surgery, Bil carpal tunnel release.    PT Comments    Pt tolerated increased ambulation this afternoon, 250' with RW and supervision. Feels that RLE is looser with increased ambulation. CPM 0-65 though pt not going into full extension, lacking 15 degrees. PT will continue to follow.   Follow Up Recommendations  Home health PT;Supervision for mobility/OOB     Equipment Recommendations  None recommended by PT    Recommendations for Other Services OT consult     Precautions / Restrictions Precautions Precautions: Fall;Knee Precaution Comments: discussed proper positioning Required Braces or Orthoses: Knee Immobilizer - Right Knee Immobilizer - Right: Other (comment) (not specified in order) Restrictions Weight Bearing Restrictions: Yes RLE Weight Bearing: Weight bearing as tolerated    Mobility  Bed Mobility Overal bed mobility: Needs Assistance Bed Mobility: Supine to Sit     Supine to sit: Min assist     General bed mobility comments: pt required min A for RLE out of CPM and off EOB as well as back into CPM  Transfers Overall transfer level: Modified independent Equipment used: Rolling walker (2 wheeled) Transfers: Sit to/from Stand Sit to Stand: Modified independent (Device/Increase time)         General transfer comment: stood safely without physical assist  Ambulation/Gait Ambulation/Gait assistance: Supervision Ambulation Distance (Feet): 250 Feet Assistive device: Rolling walker (2 wheeled) Gait Pattern/deviations: Step-through pattern;Antalgic Gait velocity: decreased Gait velocity interpretation: Below normal speed for age/gender General Gait Details: pt preferred to ambulate with KI this afternoon, shorter  step length but more confidence with ambulation   Stairs Stairs: Yes Stairs assistance: Min assist Stair Management: One rail Right;Step to pattern;Forwards Number of Stairs: 5 General stair comments: pt very apprehensive about stairs. Required min A when using only one rail. Attempted sideways stepping as well but this did not make pt feel more secure. Pt fearful that right knee would buckle which it did not but knee blocked just in case. Discussed using KI tomorrow to give more confidence if she continues with anxiety  Wheelchair Mobility    Modified Rankin (Stroke Patients Only)       Balance Overall balance assessment: Needs assistance Sitting-balance support: No upper extremity supported Sitting balance-Leahy Scale: Normal     Standing balance support: No upper extremity supported Standing balance-Leahy Scale: Fair Standing balance comment: pt able to stand without support and perform dynamic activity within BOS                    Cognition Arousal/Alertness: Awake/alert Behavior During Therapy: WFL for tasks assessed/performed Overall Cognitive Status: Within Functional Limits for tasks assessed                      Exercises Total Joint Exercises Ankle Circles/Pumps: AROM;Both;10 reps;Seated Quad Sets: AROM;Both;10 reps;Seated Heel Slides: AROM;Right;10 reps;Seated Hip ABduction/ADduction: AAROM;Right;10 reps;Seated Straight Leg Raises: AAROM;Right;10 reps;Seated Long Arc Quad: AROM;Right;10 reps;Seated Knee Flexion: AROM;Right;10 reps;Seated Goniometric ROM: 15-65    General Comments        Pertinent Vitals/Pain Pain Assessment: 0-10 Pain Score: 4  Pain Location: right knee Pain Descriptors / Indicators: Aching Pain Intervention(s): Limited activity within patient's tolerance;Monitored during session;Premedicated before session    Home Living Family/patient expects to be  discharged to:: Private residence Living Arrangements:  Children Available Help at Discharge: Family;Available 24 hours/day Type of Home: Apartment Home Access: Stairs to enter Entrance Stairs-Rails: None;Left;Right Home Layout: One level Home Equipment: Environmental consultant - 2 wheels;Bedside commode      Prior Function Level of Independence: Independent          PT Goals (current goals can now be found in the care plan section) Acute Rehab PT Goals Patient Stated Goal: to go home when ready PT Goal Formulation: With patient/family Time For Goal Achievement: 09/21/15 Potential to Achieve Goals: Good Progress towards PT goals: Progressing toward goals    Frequency  7X/week    PT Plan Current plan remains appropriate    Co-evaluation             End of Session Equipment Utilized During Treatment: Gait belt Activity Tolerance: Patient tolerated treatment well Patient left: with call bell/phone within reach;with family/visitor present;in CPM;in bed     Time: 1610-9604 PT Time Calculation (min) (ACUTE ONLY): 24 min  Charges:  $Gait Training: 8-22 mins $Therapeutic Exercise: 8-22 mins                    G Codes:     Lyanne Co, PT  Acute Rehab Services  [phone]  Lyanne Co 09/15/2015, 3:33 PM

## 2015-09-15 NOTE — Progress Notes (Signed)
Subjective: 1 Day Post-Op Procedure(s) (LRB): TOTAL KNEE ARTHROPLASTY (Right)   Patient has a fever of 101.1 this morning. She states that she feels fine. She is using her incentive spirometer. She had a UTI prior to surgery but was treated with a full course of Abx and states that she does not currently have any symptoms. She was up and walked over 200 feet with PT yesterday.  Activity level:  wbat Diet tolerance:  ok Voiding:  ok Patient reports pain as mild and moderate.    Objective: Vital signs in last 24 hours: Temp:  [98 F (36.7 C)-101.1 F (38.4 C)] 101.1 F (38.4 C) (11/09 0712) Pulse Rate:  [66-98] 98 (11/09 0712) Resp:  [10-23] 18 (11/09 0712) BP: (127-147)/(63-84) 139/65 mmHg (11/09 0712) SpO2:  [90 %-99 %] 94 % (11/09 0712) Weight:  [108.228 kg (238 lb 9.6 oz)] 108.228 kg (238 lb 9.6 oz) (11/08 0917)  Labs: No results for input(s): HGB in the last 72 hours. No results for input(s): WBC, RBC, HCT, PLT in the last 72 hours. No results for input(s): NA, K, CL, CO2, BUN, CREATININE, GLUCOSE, CALCIUM in the last 72 hours. No results for input(s): LABPT, INR in the last 72 hours.  Physical Exam:  Neurologically intact ABD soft Neurovascular intact Sensation intact distally Intact pulses distally Dorsiflexion/Plantar flexion intact Incision: dressing C/D/I and no drainage No cellulitis present Compartment soft  Assessment/Plan:  1 Day Post-Op Procedure(s) (LRB): TOTAL KNEE ARTHROPLASTY (Right) Advance diet Up with therapy D/C IV fluids Plan for discharge tomorrow Discharge home with home health if doing well and cleared by PT. Tylenol was given for fever. We will continue to monitor her. Follow up in office 2 weeks post op. I will change dressing to aquacel tomorrow.  Continue on ASA 325mg  BIDx 2 weeks post op for dvt prevention.   Charvi Gammage, Ginger OrganNDREW PAUL 09/15/2015, 8:00 AM

## 2015-09-15 NOTE — Progress Notes (Signed)
Physical Therapy Treatment Patient Details Name: Rhonda HopperSharon Krell MRN: [id number] DOB: January 18, 1961 Today's Date: 09/15/2015    History of Present Illness Pt is a 54 y/o F s/p Rt TKA.  Pt's PMH includes COPD, DMII, swelling of Bil ankles, Bil thumb surgery, Bil carpal tunnel release.    PT Comments    Pt progressing well today, tolerated increased ambulation as well as stairs but pt very anxious about stairs, requires min A for safety. PT will continue to follow.   Follow Up Recommendations  Home health PT;Supervision for mobility/OOB     Equipment Recommendations  None recommended by PT    Recommendations for Other Services OT consult     Precautions / Restrictions Precautions Precautions: Fall;Knee Precaution Comments: discussed proper positioning Required Braces or Orthoses: Knee Immobilizer - Right Knee Immobilizer - Right: Other (comment) (not specified in order) Restrictions Weight Bearing Restrictions: Yes RLE Weight Bearing: Weight bearing as tolerated    Mobility  Bed Mobility Overal bed mobility: Needs Assistance Bed Mobility: Supine to Sit     Supine to sit: Min assist     General bed mobility comments: pt having diffciulty moving RLE due to soreness, attempted to move it off bed with UE's and then LLE but ultimately needed min A from therapist to get fully to EOB  Transfers Overall transfer level: Modified independent Equipment used: Rolling walker (2 wheeled) Transfers: Sit to/from Stand Sit to Stand: Modified independent (Device/Increase time)         General transfer comment: stood safely without physical assist  Ambulation/Gait Ambulation/Gait assistance: Supervision Ambulation Distance (Feet): 225 Feet Assistive device: Rolling walker (2 wheeled) Gait Pattern/deviations: Step-through pattern;Decreased stance time - right;Decreased step length - right;Decreased weight shift to right Gait velocity: decreased Gait velocity interpretation: Below  normal speed for age/gender General Gait Details: worked on posture during gait   Stairs Stairs: Yes Stairs assistance: Min assist Stair Management: One rail Right;Step to pattern;Forwards Number of Stairs: 5 General stair comments: pt very apprehensive about stairs. Required min A when using only one rail. Attempted sideways stepping as well but this did not make pt feel more secure. Pt fearful that right knee would buckle which it did not but knee blocked just in case. Discussed using KI tomorrow to give more confidence if she continues with anxiety  Wheelchair Mobility    Modified Rankin (Stroke Patients Only)       Balance Overall balance assessment: Needs assistance Sitting-balance support: No upper extremity supported Sitting balance-Leahy Scale: Good     Standing balance support: No upper extremity supported Standing balance-Leahy Scale: Fair Standing balance comment: pt able to stand without support and perform dynamic activity within BOS                    Cognition Arousal/Alertness: Awake/alert Behavior During Therapy: WFL for tasks assessed/performed Overall Cognitive Status: Within Functional Limits for tasks assessed                      Exercises Total Joint Exercises Ankle Circles/Pumps: AROM;Both;10 reps;Seated Quad Sets: AROM;Both;10 reps;Seated Heel Slides: AROM;Right;10 reps;Seated Hip ABduction/ADduction: AAROM;Right;10 reps;Seated Straight Leg Raises: AAROM;Right;10 reps;Seated Long Arc Quad: AROM;Right;10 reps;Seated Goniometric ROM: 15-60    General Comments        Pertinent Vitals/Pain Pain Assessment: 0-10 Pain Score: 5  Pain Location: right knee Pain Descriptors / Indicators: Aching Pain Intervention(s): Limited activity within patient's tolerance;Monitored during session;Premedicated before session    Home Living Family/patient expects to  be discharged to:: Private residence Living Arrangements: Children Available  Help at Discharge: Family;Available 24 hours/day Type of Home: Apartment Home Access: Stairs to enter Entrance Stairs-Rails: None;Left;Right Home Layout: One level Home Equipment: Environmental consultant - 2 wheels;Bedside commode      Prior Function Level of Independence: Independent          PT Goals (current goals can now be found in the care plan section) Acute Rehab PT Goals Patient Stated Goal: to go home when ready PT Goal Formulation: With patient/family Time For Goal Achievement: 09/21/15 Potential to Achieve Goals: Good Progress towards PT goals: Progressing toward goals    Frequency  7X/week    PT Plan Current plan remains appropriate    Co-evaluation             End of Session Equipment Utilized During Treatment: Gait belt Activity Tolerance: Patient tolerated treatment well Patient left: in chair;with call bell/phone within reach;with family/visitor present     Time: 1057-1130 PT Time Calculation (min) (ACUTE ONLY): 33 min  Charges:  $Gait Training: 23-37 mins                    G Codes:     Lyanne Co, PT  Acute Rehab Services  [phone]  Lyanne Co 09/15/2015, 2:11 PM

## 2015-09-15 NOTE — Progress Notes (Signed)
Occupational Therapy Evaluation Patient Details Name: Rhonda HopperSharon Molchan MRN: [id number] DOB: [birth year] Today's Date: 09/15/2015    History of Present Illness Pt is a 54 y/o F s/p Rt TKA.  Pt's PMH includes COPD, DMII, swelling of Bil ankles, Bil thumb surgery, Bil carpal tunnel release.   Clinical Impression   Pt admitted with the above diagnoses and presents with below problem list. Pt will benefit from continued acute OT to address the below listed deficits and maximize independence with BADLs prior to d/c home. PTA pt was independent with ADLs. Pt is currently min guard with LB ADLs and shower/toilet transfers. OT to continue to follow acutely.      Follow Up Recommendations  Supervision - Intermittent;Other (comment);No OT follow up (OOB/mobility)    Equipment Recommendations  None recommended by OT    Recommendations for Other Services       Precautions / Restrictions Precautions Precautions: Fall;Knee Precaution Comments: Reviewed knee precautions Required Braces or Orthoses: Knee Immobilizer - Right Knee Immobilizer - Right: Other (comment) (not specified in order) Restrictions Weight Bearing Restrictions: Yes RLE Weight Bearing: Weight bearing as tolerated      Mobility Bed Mobility               General bed mobility comments: in recliner  Transfers Overall transfer level: Needs assistance Equipment used: Rolling walker (2 wheeled) Transfers: Sit to/from Stand Sit to Stand: Min guard         General transfer comment: from recliner, comfort height toilet with grab bars and 3n1    Balance Overall balance assessment: Needs assistance Sitting-balance support: No upper extremity supported;Feet supported Sitting balance-Leahy Scale: Good     Standing balance support: Bilateral upper extremity supported;During functional activity Standing balance-Leahy Scale: Fair                              ADL Overall ADL's : Needs  assistance/impaired Eating/Feeding: Set up;Sitting   Grooming: Min guard;Standing   Upper Body Bathing: Set up;Sitting   Lower Body Bathing: Minimal assistance;Sit to/from stand   Upper Body Dressing : Set up;Sitting   Lower Body Dressing: Minimal assistance;Sit to/from stand   Toilet Transfer: Min guard;Ambulation;BSC;RW Toilet Transfer Details (indicate cue type and reason): educated on hand placement for toilet transfer at home and not to keep using toilet roll holder for assist with transfers.  Toileting- ArchitectClothing Manipulation and Hygiene: Min guard;Sit to/from stand   Tub/ Shower Transfer: Tub transfer;Min guard;Ambulation;3 in 1;Rolling walker Tub/Shower Transfer Details (indicate cue type and reason): edcuated on technique  Functional mobility during ADLs: Min guard;Rolling walker General ADL Comments: Pt completed toilet transfer as detailed above. Pt with knee in slightly flexed position in recliner upon therapist arrival. Educated on keeping knee in straight position and repositioned. ADL education provided.      Vision     Perception     Praxis      Pertinent Vitals/Pain Pain Assessment: 0-10 Pain Score: 6  Pain Location: rt knee Pain Descriptors / Indicators: Aching Pain Intervention(s): Limited activity within patient's tolerance;Monitored during session;Repositioned     Hand Dominance     Extremity/Trunk Assessment Upper Extremity Assessment Upper Extremity Assessment: Overall WFL for tasks assessed   Lower Extremity Assessment Lower Extremity Assessment: Defer to PT evaluation       Communication Communication Communication: No difficulties   Cognition Arousal/Alertness: Awake/alert Behavior During Therapy: WFL for tasks assessed/performed Overall Cognitive Status: Within Functional Limits for tasks  assessed                     General Comments       Exercises       Shoulder Instructions      Home Living Family/patient expects  to be discharged to:: Private residence Living Arrangements: Children Available Help at Discharge: Family;Available 24 hours/day Type of Home: Apartment Home Access: Stairs to enter Entrance Stairs-Number of Steps: 6 Entrance Stairs-Rails: None;Left;Right Home Layout: One level     Bathroom Shower/Tub: Tub/shower unit Shower/tub characteristics: Engineer, building services: Standard Bathroom Accessibility: Yes How Accessible: Accessible via walker Home Equipment: Walker - 2 wheels;Bedside commode          Prior Functioning/Environment Level of Independence: Independent             OT Diagnosis: Acute pain   OT Problem List: Impaired balance (sitting and/or standing);Decreased knowledge of use of DME or AE;Decreased knowledge of precautions;Pain   OT Treatment/Interventions: Self-care/ADL training;DME and/or AE instruction;Therapeutic activities;Patient/family education;Balance training    OT Goals(Current goals can be found in the care plan section) Acute Rehab OT Goals Patient Stated Goal: to go home when ready OT Goal Formulation: With patient Time For Goal Achievement: 09/22/15 Potential to Achieve Goals: Good ADL Goals Pt Will Perform Lower Body Bathing: with modified independence;with adaptive equipment;sit to/from stand Pt Will Perform Lower Body Dressing: with modified independence;with adaptive equipment;sit to/from stand Pt Will Perform Tub/Shower Transfer: Tub transfer;ambulating;3 in 1;rolling walker Additional ADL Goal #1: Pt will complete bed mobility at mod I level to prepare for OOB ADLs.   OT Frequency: Min 2X/week   Barriers to D/C:            Co-evaluation              End of Session Equipment Utilized During Treatment: Gait belt;Rolling walker;Right knee immobilizer  Activity Tolerance: Patient tolerated treatment well Patient left: in chair;with call bell/phone within reach;with family/visitor present;Other (comment) (with knee positioned  in extension )   Time: 1610-9604 OT Time Calculation (min): 21 min Charges:  OT General Charges $OT Visit: 1 Procedure OT Evaluation $Initial OT Evaluation Tier I: 1 Procedure G-Codes:    Pilar Grammes 10/01/2015, 1:11 PM

## 2015-09-15 NOTE — Clinical Social Work Note (Signed)
CSW received referral for SNF.  Case discussed with case manager and plan is to discharge home.  CSW to sign off please re-consult if social work needs arise.  Quindarius Cabello R. Anaclara Acklin, MSW, LCSWA [phone]  

## 2015-09-15 NOTE — Progress Notes (Signed)
Orthopedic Tech Progress Note Patient Details:  Rhonda Ward 10/26/1961 [id number]  Patient ID: Rhonda HoMurriel HopperpperSharon Ward, female   DOB: 10/26/1961, 54 y.o.   MRN: [id number] Placed pt's rle on cpm @0 -55 degrees @1400   Rhonda Ward, Rhonda Ward 09/15/2015, 1:55 PM

## 2015-09-15 NOTE — Care Management Note (Addendum)
Case Management Note  Patient Details  Name: Rhonda Ward MRN: [id number] Date of Birth: July 03, 1961  Subjective/Objective:  54 yr old female s/p right total knee arthroplasty.                  Action/Plan: Case manager spoke with patient at bedside  Concerning home health and DME needs. Patient states walker, 3in1 and CPM have been delivered to her home. Choice was offered for home health. [place]. Referral was called to [facility] [phone], demographics, orders, op note and H & P were faxed to [phone].Patient states her daughter will assist her at discharge. No further case manager needs identified.  09/15/15  10:38 AM CM received [facility] that [facility] will not have a therapist available for 10 days, which isn't acceptable for our needs. Case manager will contact [facility]. Case manager spoke with Clydie BraunKaren at [facility], [phone], [facility] , [facility] notes, demographics to [phone].  Clydie BraunKaren stated that start of care will be Monday, 09/20/15. Case manager informed Dr. Jerl Santosalldorf of same.    Expected Discharge Date:   09/16/15       Expected Discharge Plan:  Home w Home Health Services  In-House Referral:  NA  Discharge planning Services  CM Consult  Post Acute Care Choice:  Home Health, Durable Medical Equipment Choice offered to:  Patient  DME Arranged:  3-N-1, CPM, Walker rolling DME Agency:  [employer]  HH Arranged:  PT, OT, Nurse's Aide HH Agency:   [facility]   Status of Service:  Completed, signed off  Medicare Important Message Given:    Date Medicare IM Given:    Medicare IM give by:    Date Additional Medicare IM Given:    Additional Medicare Important Message give by:     If discussed at Long Length of Stay Meetings, dates discussed:    Additional Comments:  Durenda GuthrieBrady, Isabel Ardila Naomi, RN 09/15/2015, 10:29 AM

## 2015-09-16 LAB — URINALYSIS, ROUTINE W REFLEX MICROSCOPIC
BILIRUBIN URINE: NEGATIVE
Glucose, UA: NEGATIVE mg/dL
Ketones, ur: 15 mg/dL — AB
NITRITE: NEGATIVE
Protein, ur: NEGATIVE mg/dL
SPECIFIC GRAVITY, URINE: 1.015 (ref 1.005–1.030)
UROBILINOGEN UA: 1 mg/dL (ref 0.0–1.0)
pH: 6 (ref 5.0–8.0)

## 2015-09-16 LAB — URINE MICROSCOPIC-ADD ON

## 2015-09-16 LAB — GLUCOSE, CAPILLARY: Glucose-Capillary: 120 mg/dL — ABNORMAL HIGH (ref 65–99)

## 2015-09-16 MED ORDER — SULFAMETHOXAZOLE-TRIMETHOPRIM 800-160 MG PO TABS
1.0000 | ORAL_TABLET | Freq: Once | ORAL | Status: AC
  Administered 2015-09-16: 1 via ORAL
  Filled 2015-09-16: qty 1

## 2015-09-16 MED ORDER — SULFAMETHOXAZOLE-TRIMETHOPRIM 800-160 MG PO TABS
1.0000 | ORAL_TABLET | Freq: Two times a day (BID) | ORAL | Status: DC
  Administered 2015-09-16 – 2015-09-17 (×2): 1 via ORAL
  Filled 2015-09-16 (×2): qty 1

## 2015-09-16 NOTE — Progress Notes (Signed)
Pt temp this morning 102.7, prn tylenol given, pt encouraged use of incentive spirometer. Temp rechecked came down to 100.6. Will continue to monitor.

## 2015-09-16 NOTE — Progress Notes (Signed)
Physical Therapy Treatment Patient Details Name: Rhonda HopperSharon Holaday MRN: [id number] DOB: 1961/05/11 Today's Date: 09/16/2015    History of Present Illness Pt is a 54 y/o F s/p Rt TKA.  Pt's PMH includes COPD, DMII, swelling of Bil ankles, Bil thumb surgery, Bil carpal tunnel release.    PT Comments    Pt without pain meds since this AM and had not requested them. RN provided meds during session and pt agreeable to gait and CPM but deferred exercise secondary to pain. Pt able to recall cues for bed mobility from AM and performed stairs with dgtr.   Follow Up Recommendations  Home health PT;Supervision for mobility/OOB     Equipment Recommendations       Recommendations for Other Services       Precautions / Restrictions Precautions Precautions: Fall;Knee Restrictions RLE Weight Bearing: Weight bearing as tolerated    Mobility  Bed Mobility Overal bed mobility: Modified Independent             General bed mobility comments: pt able to bring RLE onto surface with use of LLE   Transfers Overall transfer level: Modified independent               General transfer comment: from chair and toilet  Ambulation/Gait Ambulation/Gait assistance: Supervision Ambulation Distance (Feet): 125 Feet Assistive device: Rolling walker (2 wheeled) Gait Pattern/deviations: Step-through pattern;Decreased stride length;Antalgic   Gait velocity interpretation: Below normal speed for age/gender General Gait Details: cues for posture and position in RW   Stairs Stairs: Yes Stairs assistance: Min assist Stair Management: Backwards;With walker Number of Stairs: 2 General stair comments: dgtr present to assist with stairs with RW with pm  Wheelchair Mobility    Modified Rankin (Stroke Patients Only)       Balance                                    Cognition Arousal/Alertness: Awake/alert Behavior During Therapy: WFL for tasks assessed/performed Overall  Cognitive Status: Within Functional Limits for tasks assessed                      Exercises      General Comments        Pertinent Vitals/Pain Pain Score: 8  Pain Location: right knee Pain Descriptors / Indicators: Aching;Throbbing Pain Intervention(s): Limited activity within patient's tolerance;Repositioned;Patient requesting pain meds-RN notified;Ice applied;Monitored during session    Home Living                      Prior Function            PT Goals (current goals can now be found in the care plan section) Progress towards PT goals: Progressing toward goals    Frequency       PT Plan Current plan remains appropriate    Co-evaluation             End of Session   Activity Tolerance: Patient tolerated treatment well Patient left: in bed;in CPM;with call bell/phone within reach;with family/visitor present     Time: [id number] PT Time Calculation (min) (ACUTE ONLY): 22 min  Charges:  $Gait Training: 8-22 mins                    G Codes:      Delorse Lekabor, Shaily Librizzi Beth 09/16/2015, 1:28 PM Delaney MeigsMaija Tabor Arvle Grabe, PT [phone]

## 2015-09-16 NOTE — Progress Notes (Signed)
Subjective: 2 Days Post-Op Procedure(s) (LRB): TOTAL KNEE ARTHROPLASTY (Right)   Patient continues with fever this morning. It does go down with tylenol. She denies any respiratory, urinary, or other complaints and says that she feels fine other than fever.   Activity level:  wbat Diet tolerance:  ok Voiding:  ok Patient reports pain as mild.    Objective: Vital signs in last 24 hours: Temp:  [98.9 F (37.2 C)-102.7 F (39.3 C)] 100.6 F (38.1 C) (11/10 0600) Pulse Rate:  [89-109] 109 (11/10 0437) Resp:  [18] 18 (11/10 0437) BP: (143-150)/(65-76) 143/65 mmHg (11/10 0437) SpO2:  [90 %-94 %] 90 % (11/10 0437)  Labs: No results for input(s): HGB in the last 72 hours. No results for input(s): WBC, RBC, HCT, PLT in the last 72 hours. No results for input(s): NA, K, CL, CO2, BUN, CREATININE, GLUCOSE, CALCIUM in the last 72 hours. No results for input(s): LABPT, INR in the last 72 hours.  Physical Exam:  Neurologically intact ABD soft Neurovascular intact Sensation intact distally Intact pulses distally Dorsiflexion/Plantar flexion intact Incision: dressing C/D/I and no drainage No cellulitis present Compartment soft  Assessment/Plan:  2 Days Post-Op Procedure(s) (LRB): TOTAL KNEE ARTHROPLASTY (Right) Advance diet Up with therapy Plan for discharge tomorrow Discharge home with home health if fever has gone down and patient continuing to do well. We have sent off a UA and culture to check for return of UTI. Patient continues to use incentive spirometer and has no respitory complaints or any other discomfort other than fever. Dressing changed today to aquacel. Continue on ASA 325mg  BID x 2 weeks post op.   Suzzette Gasparro, Ginger OrganNDREW PAUL 09/16/2015, 10:45 AM

## 2015-09-16 NOTE — Progress Notes (Signed)
Physical Therapy Treatment Patient Details Name: Rhonda Ward MRN: [id number] DOB: [birth year] Today's Date: 09/16/2015    History of Present Illness Pt is a 54 y/o F s/p Rt TKA.  Pt's PMH includes COPD, DMII, swelling of Bil ankles, Bil thumb surgery, Bil carpal tunnel release.    PT Comments    Pt with improved ability on stairs with RW backward, good gait with no buckling without KI and returned to bed in CPM. Pt with limited rOM and strength and encouraged increased HEP throughout the day. Will continue to follow.   Follow Up Recommendations  Home health PT;Supervision for mobility/OOB     Equipment Recommendations       Recommendations for Other Services       Precautions / Restrictions Precautions Precautions: Fall;Knee Precaution Comments: discussed proper positioning Required Braces or Orthoses: Knee Immobilizer - Right (no buckling with gait and left off) Restrictions RLE Weight Bearing: Weight bearing as tolerated    Mobility  Bed Mobility Overal bed mobility: Modified Independent Bed Mobility: Sit to Supine       Sit to supine: Modified independent (Device/Increase time)   General bed mobility comments: pt able to bring RLE onto surface with use of LLE with cues  Transfers Overall transfer level: Modified independent                  Ambulation/Gait Ambulation/Gait assistance: Supervision Ambulation Distance (Feet): 250 Feet Assistive device: Rolling walker (2 wheeled) Gait Pattern/deviations: Step-through pattern;Decreased stride length;Antalgic   Gait velocity interpretation: Below normal speed for age/gender General Gait Details: cues for posture and position in RW   Stairs Stairs: Yes Stairs assistance: Min assist Stair Management: Backwards;With walker Number of Stairs: 6 General stair comments: pt with good use of rW backward on stairs and felt more confidant with this method, Assist to control RW and handout provided  Wheelchair  Mobility    Modified Rankin (Stroke Patients Only)       Balance                                    Cognition Arousal/Alertness: Awake/alert Behavior During Therapy: WFL for tasks assessed/performed Overall Cognitive Status: Within Functional Limits for tasks assessed                      Exercises Total Joint Exercises Quad Sets: AROM;Seated;Right;5 reps Short Arc Quad: AROM;Seated;Right;15 reps Heel Slides: AAROM;Seated;Right;15 reps Hip ABduction/ADduction: AROM;Right;15 reps;Supine Straight Leg Raises: AAROM;Right;15 reps;Supine Goniometric ROM: 10-55    General Comments        Pertinent Vitals/Pain Pain Score: 4  Pain Location: right knee Pain Descriptors / Indicators: Aching Pain Intervention(s): Limited activity within patient's tolerance;Monitored during session;Premedicated before session;Repositioned;Ice applied    Home Living                      Prior Function            PT Goals (current goals can now be found in the care plan section) Progress towards PT goals: Progressing toward goals    Frequency       PT Plan Current plan remains appropriate    Co-evaluation             End of Session   Activity Tolerance: Patient tolerated treatment well Patient left: in bed;with call bell/phone within reach;with family/visitor present     Time: 8413-2440 PT  Time Calculation (min) (ACUTE ONLY): 32 min  Charges:  $Gait Training: 8-22 mins $Therapeutic Exercise: 8-22 mins                    G Codes:      Delorse Lekabor, Lucienne Sawyers Beth 09/16/2015, 9:23 AM Delaney MeigsMaija Tabor Areyanna Figeroa, PT [phone]

## 2015-09-16 NOTE — Progress Notes (Signed)
Occupational Therapy Treatment Patient Details Name: Rhonda HopperSharon Mcnulty MRN: [id number] DOB: Nov 12, 1960 Today's Date: 09/16/2015    History of present illness Pt is a 54 y/o F s/p Rt TKA.  Pt's PMH includes COPD, DMII, swelling of Bil ankles, Bil thumb surgery, Bil carpal tunnel release.   OT comments  Focus of session included bed mobility and LB dressing and bathing using adaptive equipment. Pt at mod I level for bed mobility required verbal cues to use LLE to lift RLE out of bed. Pt educated in adaptive equipment for LB dressing and bathing and was able to return safe demonstration using sock-aid and reacher. Pt c/o 7/10 pain, however still motivated to participate in therapy session. Overall, pt making progress towards OT goals.  Follow Up Recommendations  Supervision - Intermittent;Other (comment);No OT follow up    Equipment Recommendations  None recommended by OT    Recommendations for Other Services      Precautions / Restrictions Precautions Precautions: Fall;Knee Precaution Comments: reviwed donning Knee Immobilizer Required Braces or Orthoses: Knee Immobilizer - Right Restrictions Weight Bearing Restrictions: Yes RLE Weight Bearing: Weight bearing as tolerated       Mobility Bed Mobility Overal bed mobility: Modified Independent Bed Mobility: Supine to Sit     Supine to sit: Modified independent (Device/Increase time)     General bed mobility comments: pt able to bring RLE onto surface with use of LLE   Transfers Overall transfer level: Modified independent Equipment used: Rolling walker (2 wheeled) Transfers: Sit to/from Stand Sit to Stand: Modified independent (Device/Increase time)         General transfer comment:     Balance Overall balance assessment: Needs assistance Sitting-balance support: No upper extremity supported;Feet supported Sitting balance-Leahy Scale: Good     Standing balance support: Bilateral upper extremity supported Standing  balance-Leahy Scale: Fair Standing balance comment: pt required UE support from rolling walker                   ADL Overall ADL's : Needs assistance/impaired               Lower Body Bathing Details (indicate cue type and reason): educated pt in using long hand sponge to assist with LB bathing     Lower Body Dressing: Minimal assistance;With adaptive equipment;Sitting/lateral leans Lower Body Dressing Details (indicate cue type and reason): pt able to use sock-aid with min A and verbal cues for sequence to don Lt LE sock             Functional mobility during ADLs: Supervision/safety;Rolling walker General ADL Comments: Pt educated in adaptive equipment to assist with ADLs. Pt able to use reacher to doff Lt LE sock and was able to don Lt LE sock using sock-aid with min A      Vision                     Perception     Praxis      Cognition   Behavior During Therapy: [facility] for tasks assessed/performed Overall Cognitive Status: Within Functional Limits for tasks assessed                       Extremity/Trunk Assessment               Exercises     Shoulder Instructions       General Comments      Pertinent Vitals/ Pain       Pain  Assessment: 0-10 Pain Score: 7  Pain Location: right knee Pain Descriptors / Indicators: Aching;Throbbing Pain Intervention(s): Limited activity within patient's tolerance;Repositioned;Patient requesting pain meds-RN notified  Home Living                                          Prior Functioning/Environment              Frequency Min 2X/week     Progress Toward Goals  OT Goals(current goals can now be found in the care plan section)  Progress towards OT goals: Progressing toward goals  Acute Rehab OT Goals Patient Stated Goal: to go home when ready OT Goal Formulation: With patient Time For Goal Achievement: 09/22/15 Potential to Achieve Goals: Good ADL Goals Pt Will  Perform Lower Body Bathing: with modified independence;with adaptive equipment;sit to/from stand Pt Will Perform Lower Body Dressing: with modified independence;with adaptive equipment;sit to/from stand Pt Will Perform Tub/Shower Transfer: Tub transfer;ambulating;3 in 1;rolling walker Additional ADL Goal #1: Pt will complete bed mobility at mod I level to prepare for OOB ADLs.   Plan Discharge plan remains appropriate    Co-evaluation                 End of Session Equipment Utilized During Treatment: Gait belt;Rolling walker [name] [name]: On Right Knee Flexion (Degrees): 70 Right Knee Extension (Degrees): 0   Activity Tolerance Patient tolerated treatment well;Patient limited by pain   Patient Left in chair;with call bell/phone within reach;with family/visitor present   Nurse Communication Patient requests pain meds        Time: 1610-9604 OT Time Calculation (min): 18 min  Charges: OT General Charges $OT Visit: 1 Procedure OT Treatments $Self Care/Home Management : 8-22 mins  Smiley Houseman 09/16/2015, 1:37 PM

## 2015-09-17 LAB — URINE CULTURE: Culture: 7000

## 2015-09-17 MED ORDER — SULFAMETHOXAZOLE-TRIMETHOPRIM 800-160 MG PO TABS: 1.0000 | ORAL_TABLET | Freq: Two times a day (BID) | ORAL | Status: AC

## 2015-09-17 MED ORDER — OXYCODONE-ACETAMINOPHEN 7.5-325 MG PO TABS: 1.0000 | ORAL_TABLET | Freq: Four times a day (QID) | ORAL | Status: AC | PRN

## 2015-09-17 MED ORDER — ASPIRIN 325 MG PO TBEC: 325.0000 mg | DELAYED_RELEASE_TABLET | Freq: Two times a day (BID) | ORAL | Status: AC

## 2015-09-17 MED ORDER — METHOCARBAMOL 500 MG PO TABS: 500.0000 mg | ORAL_TABLET | Freq: Four times a day (QID) | ORAL | Status: AC | PRN

## 2015-09-17 NOTE — Progress Notes (Signed)
Physical Therapy Treatment Patient Details Name: Rhonda Ward MRN: [id number] DOB: 07-Jul-1961 Today's Date: 09/17/2015    History of Present Illness Pt is a 54 y/o F s/p Rt TKA.  Pt's PMH includes COPD, DMII, swelling of Bil ankles, Bil thumb surgery, Bil carpal tunnel release.    PT Comments    Ms.Rhonda Ward is moving well with increased gait, good ability with transfers and continues to require assist to complete all HEP. All questions answered, pt safe for D/C and educated for need to walk halfway through ride home and continue HEP and CPM.   Follow Up Recommendations  Home health PT;Supervision for mobility/OOB     Equipment Recommendations       Recommendations for Other Services       Precautions / Restrictions Precautions Precautions: Fall;Knee Restrictions Weight Bearing Restrictions: Yes RLE Weight Bearing: Weight bearing as tolerated    Mobility  Bed Mobility Overal bed mobility: Modified Independent             General bed mobility comments: utilizing LLE to assist RLE  Transfers Overall transfer level: Modified independent               General transfer comment: from bed and toilet  Ambulation/Gait Ambulation/Gait assistance: Supervision Ambulation Distance (Feet): 400 Feet Assistive device: Rolling walker (2 wheeled) Gait Pattern/deviations: Step-through pattern;Decreased stride length   Gait velocity interpretation: Below normal speed for age/gender     Stairs            Wheelchair Mobility    Modified Rankin (Stroke Patients Only)       Balance                                    Cognition Arousal/Alertness: Awake/alert Behavior During Therapy: WFL for tasks assessed/performed Overall Cognitive Status: Within Functional Limits for tasks assessed                      Exercises Total Joint Exercises Heel Slides: Seated;AAROM;Right;15 reps Hip ABduction/ADduction: AROM;Seated;Right;15  reps Straight Leg Raises: AAROM;Seated;Right;15 reps Long Arc Quad: AROM;Seated;Right;15 reps Marching in Standing: AROM;Seated;Right;15 reps    General Comments        Pertinent Vitals/Pain Pain Assessment: No/denies pain    Home Living                      Prior Function            PT Goals (current goals can now be found in the care plan section) Progress towards PT goals: Progressing toward goals    Frequency       PT Plan Current plan remains appropriate    Co-evaluation             End of Session   Activity Tolerance: Patient tolerated treatment well Patient left: in chair;with call bell/phone within reach;with family/visitor present     Time: 0742-0803 PT Time Calculation (min) (ACUTE ONLY): 21 min  Charges:  $Gait Training: 8-22 mins                    G Codes:      Rhonda Ward, Rhonda Ward 09/17/2015, 8:06 AM Rhonda Ward, PT [phone]

## 2015-09-17 NOTE — Discharge Summary (Signed)
Patient ID: Rhonda Ward MRN: [id number] DOB/AGE: 1961/05/11 54 y.o.  Admit date: 09/14/2015 Discharge date: 09/17/2015  Admission Diagnoses:  Principal Problem:   Primary osteoarthritis of right knee   Discharge Diagnoses:  Same  Past Medical History  Diagnosis Date  . COPD (chronic obstructive pulmonary disease) (HCC)   . Diabetes mellitus without complication (HCC)     Type 2  . Constipation   . GERD (gastroesophageal reflux disease)   . Arthritis   . Swelling of both ankles   . Shortness of breath dyspnea   . Anemia     Surgeries: Procedure(s): TOTAL KNEE ARTHROPLASTY on 09/14/2015   Consultants:    Discharged Condition: Improved  Hospital Course: Rhonda Ward is an 54 y.o. female who was admitted 09/14/2015 for operative treatment ofPrimary osteoarthritis of right knee. Patient has severe unremitting pain that affects sleep, daily activities, and work/hobbies. After pre-op clearance the patient was taken to the operating room on 09/14/2015 and underwent  Procedure(s): TOTAL KNEE ARTHROPLASTY.    Patient was given perioperative antibiotics: Anti-infectives    Start     Dose/Rate Route Frequency Ordered Stop   09/17/15 0000  sulfamethoxazole-trimethoprim (BACTRIM DS,SEPTRA DS) 800-160 MG tablet     1 tablet Oral Every 12 hours 09/17/15 0756     09/16/15 2200  sulfamethoxazole-trimethoprim (BACTRIM DS,SEPTRA DS) 800-160 MG per tablet 1 tablet     1 tablet Oral  Once 09/16/15 1601 09/16/15 2059   09/16/15 1730  sulfamethoxazole-trimethoprim (BACTRIM DS,SEPTRA DS) 800-160 MG per tablet 1 tablet     1 tablet Oral Every 12 hours 09/16/15 1556     09/14/15 1730  ceFAZolin (ANCEF) IVPB 2 g/50 mL premix     2 g 100 mL/hr over 30 Minutes Intravenous Every 6 hours 09/14/15 1530 09/15/15 0136   09/14/15 0945  ceFAZolin (ANCEF) IVPB 2 g/50 mL premix     2 g 100 mL/hr over 30 Minutes Intravenous To ShortStay Surgical 09/14/15 0928 09/14/15 1120   09/14/15 0930  ceFAZolin  (ANCEF) 2-3 GM-% IVPB SOLR    Comments:  [place] Large   : cabinet override      09/14/15 0930 09/14/15 2144       Patient was given sequential compression devices, early ambulation, and chemoprophylaxis to prevent DVT.  Patient had a fever and her urine analysis showed a UTI. She was started on Bactrim DS and her fever and symptoms resolved. She will continue on this medication BID for 1 week.   Patient benefited maximally from hospital stay and there were no complications.    Recent vital signs: Patient Vitals for the past 24 hrs:  BP Temp Temp src Pulse Resp SpO2  09/17/15 0540 136/73 mmHg 99.3 F (37.4 C) Oral 93 18 95 %  09/16/15 2055 135/70 mmHg 99.5 F (37.5 C) Oral 95 16 94 %  09/16/15 1300 (!) 152/76 mmHg 99.8 F (37.7 C) - (!) 106 18 95 %     Recent laboratory studies: No results for input(s): WBC, HGB, HCT, PLT, NA, K, CL, CO2, BUN, CREATININE, GLUCOSE, INR, CALCIUM in the last 72 hours.  Invalid input(s): PT, 2   Discharge Medications:     Medication List    TAKE these medications        acetaminophen 500 MG tablet  Commonly known as:  TYLENOL  Take 1,000 mg by mouth every 6 (six) hours as needed (pain).     aspirin 325 MG EC tablet  Take 1 tablet (325 mg total) by mouth  2 (two) times daily after a meal.     Fluticasone-Salmeterol 250-50 MCG/DOSE Aepb  Commonly known as:  ADVAIR  Inhale 1 puff into the lungs 2 (two) times daily.     furosemide 40 MG tablet  Commonly known as:  LASIX  Take 40 mg by mouth daily at 12 noon.     metFORMIN 500 MG tablet  Commonly known as:  GLUCOPHAGE  Take 500 mg by mouth 2 (two) times daily.     methocarbamol 500 MG tablet  Commonly known as:  ROBAXIN  Take 1 tablet (500 mg total) by mouth every 6 (six) hours as needed for muscle spasms.     MOVANTIK 25 MG Tabs tablet  Generic drug:  naloxegol oxalate  Take 25 mg by mouth daily as needed (constipation).     nystatin cream  Commonly known as:  MYCOSTATIN  Apply  1 application topically 3 (three) times daily as needed (rash (under breasts)).     omeprazole 20 MG capsule  Commonly known as:  PRILOSEC  Take 20 mg by mouth daily.     oxyCODONE-acetaminophen 7.5-325 MG tablet  Commonly known as:  PERCOCET  Take 1-2 tablets by mouth every 6 (six) hours as needed for severe pain.     sulfamethoxazole-trimethoprim 800-160 MG tablet  Commonly known as:  BACTRIM DS,SEPTRA DS  Take 1 tablet by mouth every 12 (twelve) hours.     Vitamin D3 5000 UNITS Tabs  Take 5,000 Units by mouth daily.        Diagnostic Studies: Dg Chest 2 View  09/03/2015  CLINICAL DATA:  Preoperative examination prior to orthopedic procedure, former smoker. EXAM: CHEST  2 VIEW COMPARISON:  None in PACs FINDINGS: The lungs are mildly hyperinflated with hemidiaphragm flattening. There is no infiltrate or atelectasis. No pulmonary parenchymal nodules are observed. The heart and pulmonary vascularity are normal. The mediastinum is normal in width. There is no pleural effusion. There is mild multilevel degenerative disc disease of the thoracic spine. IMPRESSION: Mild hyperinflation may be voluntary or could reflect underlying COPD. There is no active cardiopulmonary disease. Electronically Signed   By: David  [place]azil[place] M.D.   On: 09/03/2015 16:04    Disposition: Final discharge disposition not confirmed      Discharge Instructions    Call MD / Call 911    Complete by:  As directed   If you experience chest pain or shortness of breath, CALL 911 and be transported to the hospital emergency room.  If you develope a fever above 101 F, pus (white drainage) or increased drainage or redness at the wound, or calf pain, call your surgeon's office.     Constipation Prevention    Complete by:  As directed   Drink plenty of fluids.  Prune juice may be helpful.  You may use a stool softener, such as Colace (over the counter) 100 mg twice a day.  Use MiraLax (over the counter) for constipation as  needed.     Diet - low sodium heart healthy    Complete by:  As directed      Discharge instructions    Complete by:  As directed   INSTRUCTIONS AFTER JOINT REPLACEMENT   Remove items at home which could result in a fall. This includes throw rugs or furniture in walking pathways ICE to the affected joint every three hours while awake for 30 minutes at a time, for at least the first 3-5 days, and then as needed for pain and swelling.  Continue to use ice for pain and swelling. You may notice swelling that will progress down to the foot and ankle.  This is normal after surgery.  Elevate your leg when you are not up walking on it.   Continue to use the breathing machine you got in the hospital (incentive spirometer) which will help keep your temperature down.  It is common for your temperature to cycle up and down following surgery, especially at night when you are not up moving around and exerting yourself.  The breathing machine keeps your lungs expanded and your temperature down.   DIET:  As you were doing prior to hospitalization, we recommend a well-balanced diet.  DRESSING / WOUND CARE / SHOWERING  You may shower 3 days after surgery, but keep the wounds dry during showering.  You may use an occlusive plastic wrap (Press'n Seal for example), NO SOAKING/SUBMERGING IN THE BATHTUB.  If the bandage gets wet, change with a clean dry gauze.  If the incision gets wet, pat the wound dry with a clean towel.  ACTIVITY  Increase activity slowly as tolerated, but follow the weight bearing instructions below.   No driving for 6 weeks or until further direction given by your physician.  You cannot drive while taking narcotics.  No lifting or carrying greater than 10 lbs. until further directed by your surgeon. Avoid periods of inactivity such as sitting longer than an hour when not asleep. This helps prevent blood clots.  You may return to work once you are authorized by your doctor.     WEIGHT  BEARING   Weight bearing as tolerated with assist device (walker, cane, etc) as directed, use it as long as suggested by your surgeon or therapist, typically at least 4-6 weeks.   EXERCISES  Results after joint replacement surgery are often greatly improved when you follow the exercise, range of motion and muscle strengthening exercises prescribed by your doctor. Safety measures are also important to protect the joint from further injury. Any time any of these exercises cause you to have increased pain or swelling, decrease what you are doing until you are comfortable again and then slowly increase them. If you have problems or questions, call your caregiver or physical therapist for advice.   Rehabilitation is important following a joint replacement. After just a few days of immobilization, the muscles of the leg can become weakened and shrink (atrophy).  These exercises are designed to build up the tone and strength of the thigh and leg muscles and to improve motion. Often times heat used for twenty to thirty minutes before working out will loosen up your tissues and help with improving the range of motion but do not use heat for the first two weeks following surgery (sometimes heat can increase post-operative swelling).   These exercises can be done on a training (exercise) mat, on the floor, on a table or on a bed. Use whatever works the best and is most comfortable for you.    Use music or television while you are exercising so that the exercises are a pleasant break in your day. This will make your life better with the exercises acting as a break in your routine that you can look forward to.   Perform all exercises about fifteen times, three times per day or as directed.  You should exercise both the operative leg and the other leg as well.   Exercises include:   Quad Sets - Tighten up the muscle on the front  of the thigh (Quad) and hold for 5-10 seconds.   Straight Leg Raises - With your knee  straight (if you were given a brace, keep it on), lift the leg to 60 degrees, hold for 3 seconds, and slowly lower the leg.  Perform this exercise against resistance later as your leg gets stronger.  Leg Slides: Lying on your back, slowly slide your foot toward your buttocks, bending your knee up off the floor (only go as far as is comfortable). Then slowly slide your foot back down until your leg is flat on the floor again.  Angel Wings: Lying on your back spread your legs to the side as far apart as you can without causing discomfort.  Hamstring Strength:  Lying on your back, push your heel against the floor with your leg straight by tightening up the muscles of your buttocks.  Repeat, but this time bend your knee to a comfortable angle, and push your heel against the floor.  You may put a pillow under the heel to make it more comfortable if necessary.   A rehabilitation program following joint replacement surgery can speed recovery and prevent re-injury in the future due to weakened muscles. Contact your doctor or a physical therapist for more information on knee rehabilitation.    CONSTIPATION  Constipation is defined medically as fewer than three stools per week and severe constipation as less than one stool per week.  Even if you have a regular bowel pattern at home, your normal regimen is likely to be disrupted due to multiple reasons following surgery.  Combination of anesthesia, postoperative narcotics, change in appetite and fluid intake all can affect your bowels.   YOU MUST use at least one of the following options; they are listed in order of increasing strength to get the job done.  They are all available over the counter, and you may need to use some, POSSIBLY even all of these options:    Drink plenty of fluids (prune juice may be helpful) and high fiber foods Colace 100 mg by mouth twice a day  Senokot for constipation as directed and as needed Dulcolax (bisacodyl), take with full  glass of water  Miralax (polyethylene glycol) once or twice a day as needed.  If you have tried all these things and are unable to have a bowel movement in the first 3-4 days after surgery call either your surgeon or your primary doctor.    If you experience loose stools or diarrhea, hold the medications until you stool forms back up.  If your symptoms do not get better within 1 week or if they get worse, check with your doctor.  If you experience "the worst abdominal pain ever" or develop nausea or vomiting, please contact the office immediately for further recommendations for treatment.   ITCHING:  If you experience itching with your medications, try taking only a single pain pill, or even half a pain pill at a time.  You can also use Benadryl over the counter for itching or also to help with sleep.   TED HOSE STOCKINGS:  Use stockings on both legs until for at least 2 weeks or as directed by physician office. They may be removed at night for sleeping.  MEDICATIONS:  See your medication summary on the "After Visit Summary" that nursing will review with you.  You may have some home medications which will be placed on hold until you complete the course of blood thinner medication.  It is important for you  to complete the blood thinner medication as prescribed.  PRECAUTIONS:  If you experience chest pain or shortness of breath - call 911 immediately for transfer to the hospital emergency department.   If you develop a fever greater that 101 F, purulent drainage from wound, increased redness or drainage from wound, foul odor from the wound/dressing, or calf pain - CONTACT YOUR SURGEON.                                                   FOLLOW-UP APPOINTMENTS:  If you do not already have a post-op appointment, please call the office for an appointment to be seen by your surgeon.  Guidelines for how soon to be seen are listed in your "After Visit Summary", but are typically between 1-4 weeks after  surgery.  OTHER INSTRUCTIONS:   Knee Replacement:  Do not place pillow under knee, focus on keeping the knee straight while resting. CPM instructions: 0-90 degrees, 2 hours in the morning, 2 hours in the afternoon, and 2 hours in the evening. Place foam block, curve side up under heel at all times except when in CPM or when walking.  DO NOT modify, tear, cut, or change the foam block in any way.  MAKE SURE YOU:  Understand these instructions.  Get help right away if you are not doing well or get worse.    Thank you for letting us be a part of your medical care team.  It is a privilege we respect greatly.  We hope these instructions will help you stay on track for a fast and full recovery!     Increase activity slowly as tolerated    Complete by:  As directed            Follow-up Information    Follow up with Velna Ochs, MD. Schedule an appointment as soon as possible for a visit in 2 weeks.   Specialty:  Orthopedic Surgery   Contact information:   [address] [place] [address] [phone]       Follow up with [facility]   Specialty:  Home Health Services   Why:  Someone from [facility] will contact you to arrange time for visit. Start of care will be Monday, 09/20/15   Contact information:   [address] [place] [address] [phone]        Signed: Drema Halon 09/17/2015, 7:56 AM

## 2015-09-17 NOTE — Progress Notes (Signed)
Subjective: 3 Days Post-Op Procedure(s) (LRB): TOTAL KNEE ARTHROPLASTY (Right)   Patient feels better this morning. Her urine yesterday did show a UTI. She was started on Bactrim DS and her fever has resolved. She states that she is ready to go home.  Activity level:  wbat Diet tolerance:  ok Voiding:  ok Patient reports pain as mild.    Objective: Vital signs in last 24 hours: Temp:  [99.3 F (37.4 C)-99.8 F (37.7 C)] 99.3 F (37.4 C) (11/11 0540) Pulse Rate:  [93-106] 93 (11/11 0540) Resp:  [16-18] 18 (11/11 0540) BP: (135-152)/(70-76) 136/73 mmHg (11/11 0540) SpO2:  [94 %-95 %] 95 % (11/11 0540)  Labs: No results for input(s): HGB in the last 72 hours. No results for input(s): WBC, RBC, HCT, PLT in the last 72 hours. No results for input(s): NA, K, CL, CO2, BUN, CREATININE, GLUCOSE, CALCIUM in the last 72 hours. No results for input(s): LABPT, INR in the last 72 hours.  Physical Exam:  Neurologically intact ABD soft Neurovascular intact Sensation intact distally Intact pulses distally Dorsiflexion/Plantar flexion intact Incision: dressing C/D/I and no drainage No cellulitis present Compartment soft  Assessment/Plan:  3 Days Post-Op Procedure(s) (LRB): TOTAL KNEE ARTHROPLASTY (Right) Advance diet Up with therapy Discharge home with home health today. Continue on Bactrim for UTI. Continue on ASA 325mg  BID x 2 weeks for dvt prevention.  Follow up in office 2 weeks post op.    Nayshawn Mesta, Ginger OrganNDREW PAUL 09/17/2015, 7:52 AM

## 2016-03-25 IMAGING — CR DG CHEST 2V
2 series · 2 of 2 positions shown · non-contrast
Comparison: None in PACs

CLINICAL DATA: Preoperative examination prior to orthopedic
procedure, former smoker.

EXAM:
CHEST  2 VIEW

[w chest pa]
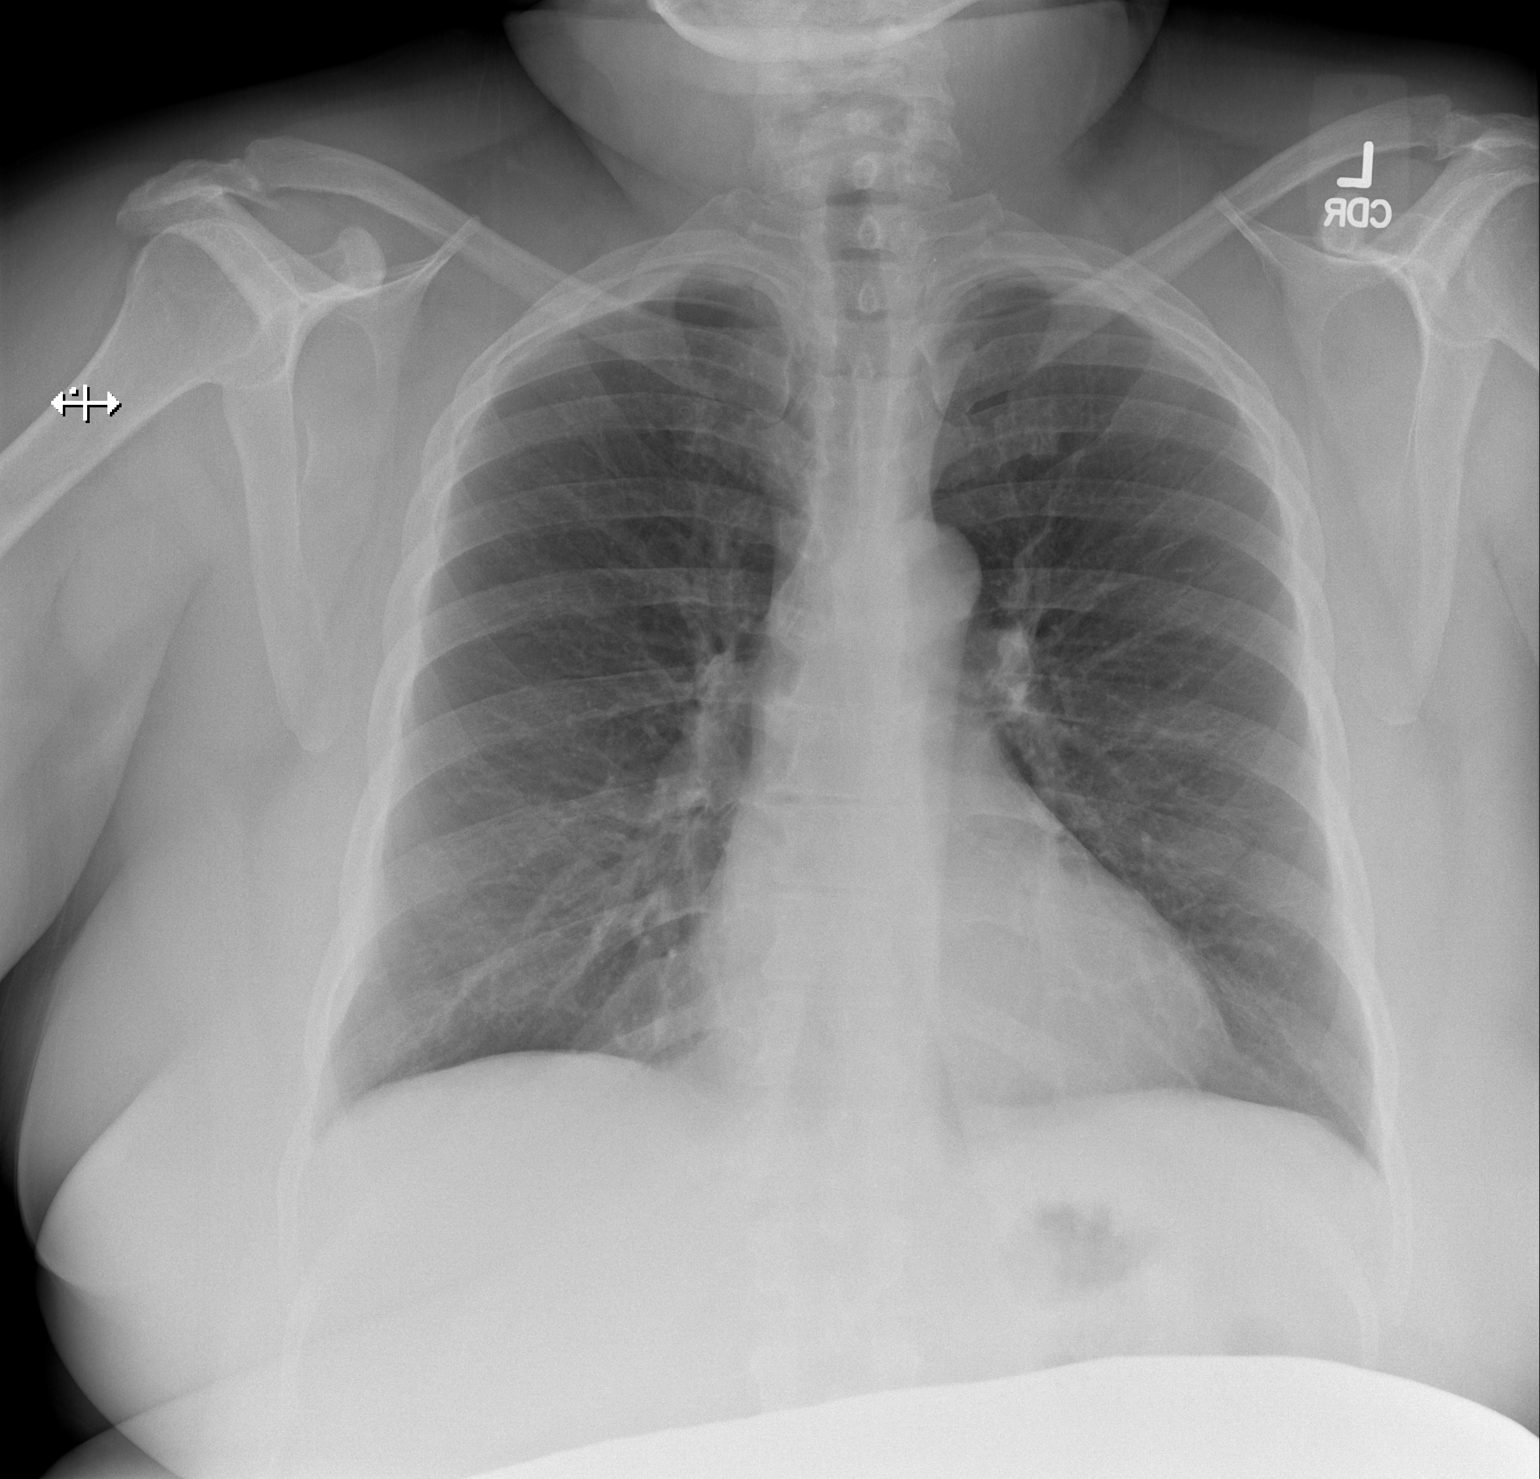

[w chest lat]
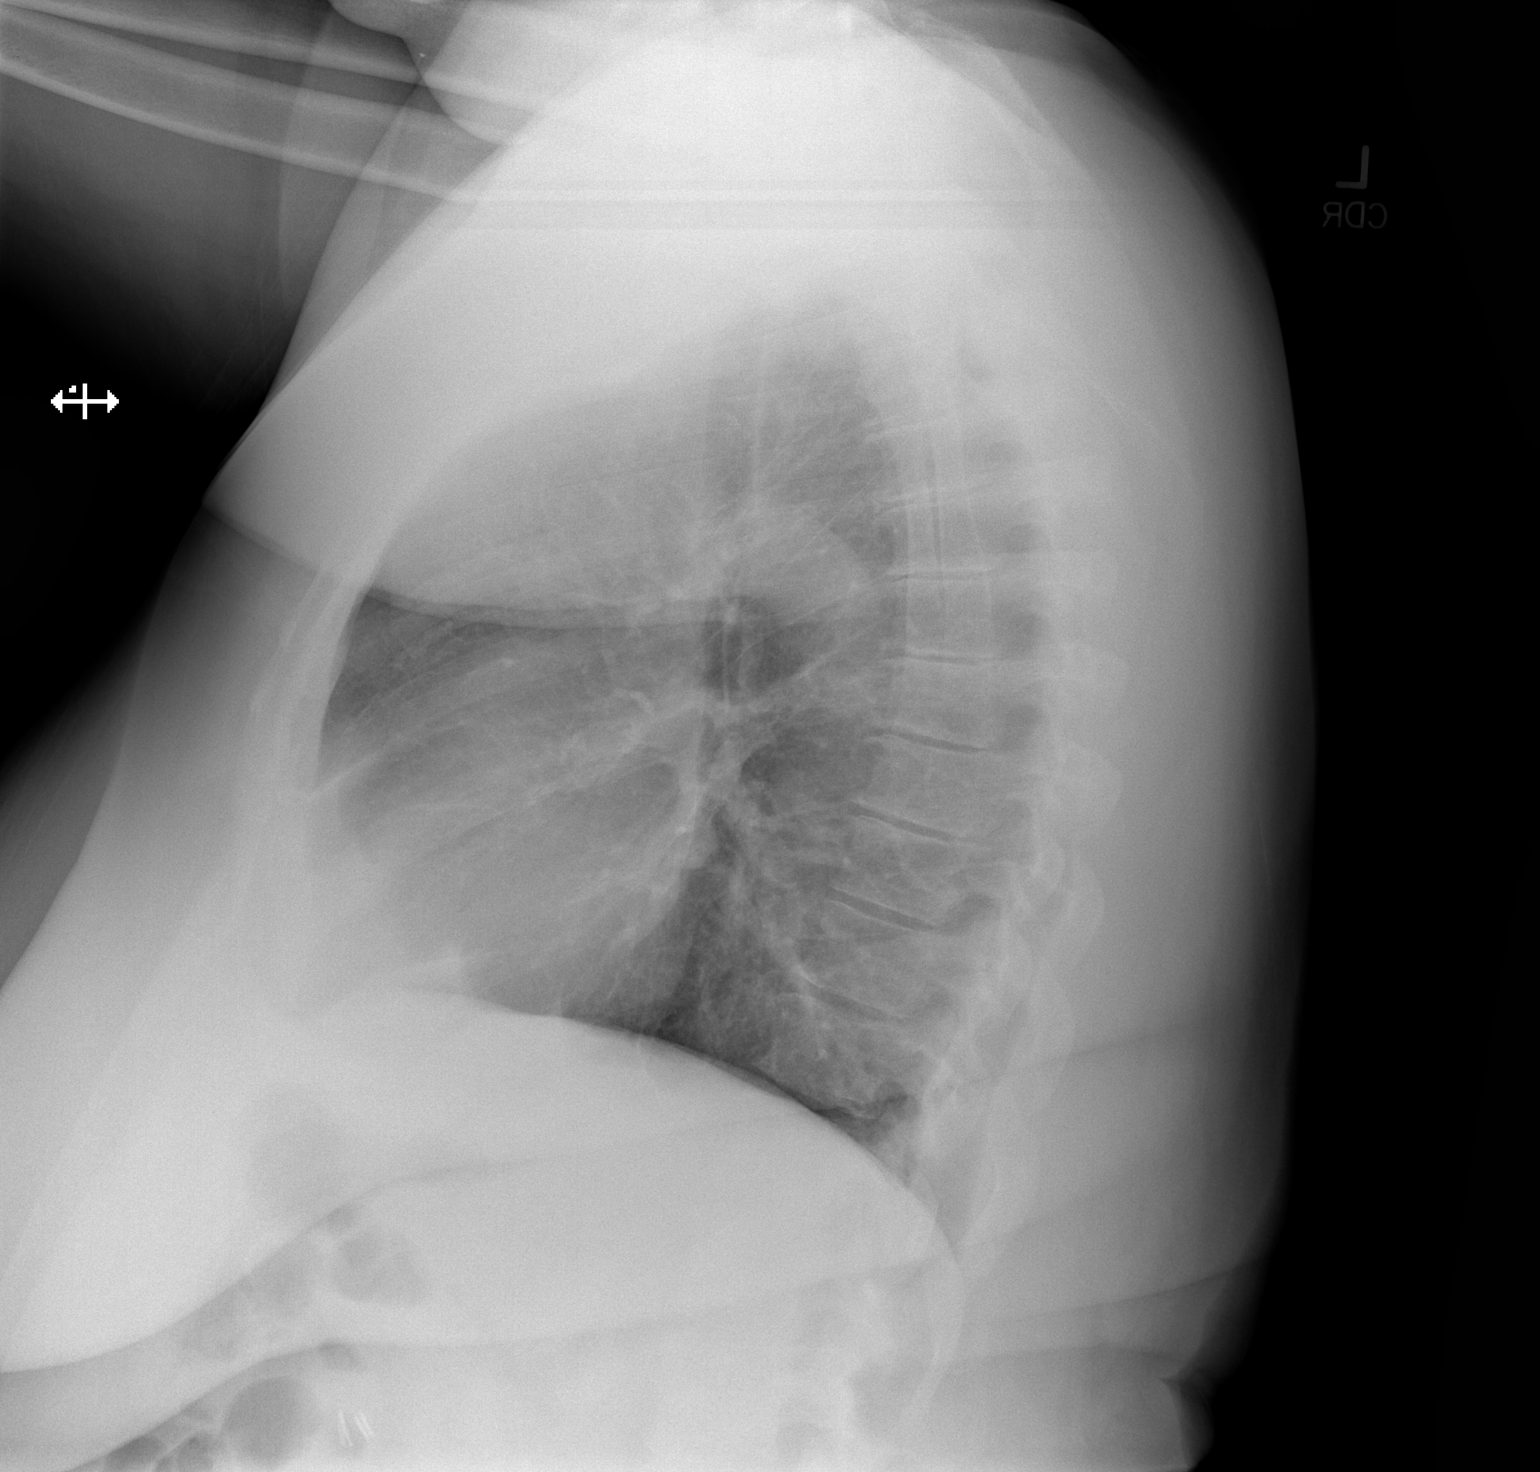

[2 of 2 positions shown; findings below may reference images not displayed]

FINDINGS: The lungs are mildly hyperinflated with hemidiaphragm flattening.
There is no infiltrate or atelectasis. No pulmonary parenchymal
nodules are observed. The heart and pulmonary vascularity are
normal. The mediastinum is normal in width. There is no pleural
effusion. There is mild multilevel degenerative disc disease of the
thoracic spine.
IMPRESSION: Mild hyperinflation may be voluntary or could reflect underlying
COPD. There is no active cardiopulmonary disease.
